# Patient Record
Sex: Female | Born: 1970 | Race: White | Hispanic: No | Marital: Married | Smoking: Never smoker
Health system: Southern US, Community
[De-identification: ages and names within clinical notes are randomized; demographics above are authoritative.]

## PROBLEM LIST (undated history)

## (undated) DIAGNOSIS — J45909 Unspecified asthma, uncomplicated: Secondary | ICD-10-CM

## (undated) DIAGNOSIS — Z87440 Personal history of urinary (tract) infections: Secondary | ICD-10-CM

## (undated) DIAGNOSIS — T7840XA Allergy, unspecified, initial encounter: Secondary | ICD-10-CM

## (undated) HISTORY — DX: Unspecified asthma, uncomplicated: J45.909

## (undated) HISTORY — DX: Personal history of urinary (tract) infections: Z87.440

## (undated) HISTORY — PX: OTHER SURGICAL HISTORY: SHX169

## (undated) HISTORY — DX: Allergy, unspecified, initial encounter: T78.40XA

---

## 1999-03-14 ENCOUNTER — Other Ambulatory Visit: Admission: RE | Admit: 1999-03-14 | Discharge: 1999-03-14 | Payer: Self-pay | Admitting: Obstetrics and Gynecology

## 2000-05-27 ENCOUNTER — Other Ambulatory Visit: Admission: RE | Admit: 2000-05-27 | Discharge: 2000-05-27 | Payer: Self-pay | Admitting: Obstetrics and Gynecology

## 2001-08-16 ENCOUNTER — Other Ambulatory Visit: Admission: RE | Admit: 2001-08-16 | Discharge: 2001-08-16 | Payer: Self-pay | Admitting: Obstetrics and Gynecology

## 2002-04-04 ENCOUNTER — Other Ambulatory Visit: Admission: RE | Admit: 2002-04-04 | Discharge: 2002-04-04 | Payer: Self-pay | Admitting: Obstetrics and Gynecology

## 2002-11-13 ENCOUNTER — Other Ambulatory Visit: Admission: RE | Admit: 2002-11-13 | Discharge: 2002-11-13 | Payer: Self-pay | Admitting: Obstetrics and Gynecology

## 2004-02-15 ENCOUNTER — Other Ambulatory Visit: Admission: RE | Admit: 2004-02-15 | Discharge: 2004-02-15 | Payer: Self-pay | Admitting: Obstetrics and Gynecology

## 2004-05-09 ENCOUNTER — Emergency Department (HOSPITAL_COMMUNITY): Admission: EM | Admit: 2004-05-09 | Discharge: 2004-05-10 | Payer: Self-pay | Admitting: Emergency Medicine

## 2011-12-11 ENCOUNTER — Ambulatory Visit (INDEPENDENT_AMBULATORY_CARE_PROVIDER_SITE_OTHER): Payer: BC Managed Care – PPO

## 2011-12-11 DIAGNOSIS — Z23 Encounter for immunization: Secondary | ICD-10-CM

## 2012-09-05 ENCOUNTER — Ambulatory Visit (INDEPENDENT_AMBULATORY_CARE_PROVIDER_SITE_OTHER): Payer: BC Managed Care – PPO | Admitting: Family Medicine

## 2012-09-05 DIAGNOSIS — Z23 Encounter for immunization: Secondary | ICD-10-CM

## 2015-03-26 ENCOUNTER — Encounter: Payer: Self-pay | Admitting: Physician Assistant

## 2015-03-26 ENCOUNTER — Ambulatory Visit (INDEPENDENT_AMBULATORY_CARE_PROVIDER_SITE_OTHER): Payer: BLUE CROSS/BLUE SHIELD | Admitting: Physician Assistant

## 2015-03-26 VITALS — BP 116/76 | HR 71 | Temp 98.0°F | Resp 16 | Ht 67.0 in | Wt 216.0 lb

## 2015-03-26 DIAGNOSIS — Z8742 Personal history of other diseases of the female genital tract: Secondary | ICD-10-CM

## 2015-03-26 DIAGNOSIS — Z7689 Persons encountering health services in other specified circumstances: Secondary | ICD-10-CM

## 2015-03-26 DIAGNOSIS — Z6833 Body mass index (BMI) 33.0-33.9, adult: Secondary | ICD-10-CM | POA: Diagnosis not present

## 2015-03-26 DIAGNOSIS — Z7189 Other specified counseling: Secondary | ICD-10-CM | POA: Diagnosis not present

## 2015-03-26 DIAGNOSIS — Z23 Encounter for immunization: Secondary | ICD-10-CM | POA: Diagnosis not present

## 2015-03-26 DIAGNOSIS — Z6830 Body mass index (BMI) 30.0-30.9, adult: Secondary | ICD-10-CM | POA: Insufficient documentation

## 2015-03-26 NOTE — Progress Notes (Signed)
Subjective:   Patient ID: Kelli Potter, female     DOB: July 05, 1971, 44 y.o.    MRN: [id number]  PCP: No primary care provider on file.  Chief Complaint  Patient presents with  . Establish Care     Prior to Admission medications   Medication Sig Start Date End Date Taking? Authorizing Provider  ibuprofen (ADVIL,MOTRIN) 100 MG chewable tablet Chew by mouth every 8 (eight) hours as needed.   Yes Historical Provider, MD     Allergies  Allergen Reactions  . Latex      Patient Active Problem List   Diagnosis Date Noted  . BMI 33.0-33.9,adult 03/26/2015     Family History  Problem Relation Age of Onset  . Pulmonary fibrosis Mother   . Hypertension Father   . Eating disorder Sister   . Thyroid disease Brother     Graves Disease     History   Social History  . Marital Status: Married    Spouse Name: Sadie HaberDavid Steeber  . Number of Children: 2  . Years of Education: 15   Occupational History  . 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 Smoking status: Never Smoker   . Smokeless tobacco: Never Used  . Alcohol Use: No  . Drug Use: No  . Sexual Activity:    Partners: Male    Birth Control/ Protection: Other-see comments     Comment: partner (Husband) s/p vasectomy   Other Topics Concern  . Not on file   Social History Narrative   Lives with her husband. 2 sons in college.   She left college after 3 years without a degree.   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     HPI Presents to establish for primary care. As not had a "physical" in "years" though did have an administrative exam before starting nursing school several years ago. Husband is stressed out, taking it out on her. She's avoiding him, and so he thinks that she's "going through the change." He has pushed her to schedule this appointment. Her CPE with pap testing is scheduled for 06/04/2015. Married 23 years. 2 sons.  Review of Systems Review of Systems    Constitutional: Negative.   HENT: Negative.   Eyes: Negative.   Respiratory: Negative.   Cardiovascular: Negative.   Gastrointestinal: Negative.   Genitourinary: Negative.   Musculoskeletal: Negative.   Skin: Negative.   Neurological: Negative.   Psychiatric/Behavioral: Negative.         Objective:  Physical Exam Physical Exam  Constitutional: She is oriented to person, place, and time. She appears well-developed and well-nourished. She is active and cooperative. No distress.  BP 116/76 mmHg  Pulse 71  Temp(Src) 98 F (36.7 C)  Resp 16  Ht 5\' 7"  (1.702 m)  Wt 216 lb (97.977 kg)  BMI 33.82 kg/m2  SpO2 100%  LMP 03/08/2015   Eyes: Conjunctivae are normal.  Neck: Neck supple. No thyromegaly present.  Cardiovascular: Normal rate, regular rhythm, normal heart sounds and intact distal pulses.   Pulmonary/Chest: Effort normal and breath sounds normal.  Lymphadenopathy:    She has no cervical adenopathy.  Neurological: She is alert and oriented to person, place, and time.  Psychiatric: She has a normal mood and affect. Her speech is normal and behavior is normal.       Assessment & Plan:  1. Encounter to establish care  2. BMI 33.0-33.9,adult Healthy eating. Increase cardiovascular exercise.  3. History of abnormal cervical Pap smear Update at CPE 06/04/2015.  4. Need for TD vaccine - Td vaccine greater than or equal to 7yo preservative free IM   Fernande Bras, PA-C Physician Assistant-Certified [facility]

## 2015-05-14 ENCOUNTER — Emergency Department (HOSPITAL_COMMUNITY): Payer: BLUE CROSS/BLUE SHIELD | Admitting: Certified Registered Nurse Anesthetist

## 2015-05-14 ENCOUNTER — Encounter (HOSPITAL_COMMUNITY): Payer: Self-pay

## 2015-05-14 ENCOUNTER — Encounter (HOSPITAL_COMMUNITY)
Admission: EM | Disposition: A | Discharge status: Home / Self Care | Payer: Self-pay | Source: Home / Self Care | Attending: Emergency Medicine

## 2015-05-14 ENCOUNTER — Emergency Department (HOSPITAL_COMMUNITY): Payer: BLUE CROSS/BLUE SHIELD

## 2015-05-14 ENCOUNTER — Observation Stay (HOSPITAL_COMMUNITY)
Admission: EM | Admit: 2015-05-14 | Discharge: 2015-05-15 | Disposition: A | Discharge status: Home / Self Care | Payer: BLUE CROSS/BLUE SHIELD | Attending: General Surgery | Admitting: General Surgery

## 2015-05-14 ENCOUNTER — Ambulatory Visit (INDEPENDENT_AMBULATORY_CARE_PROVIDER_SITE_OTHER): Payer: BLUE CROSS/BLUE SHIELD | Admitting: Emergency Medicine

## 2015-05-14 VITALS — BP 116/80 | HR 85 | Temp 98.0°F | Resp 16 | Ht 67.0 in | Wt 213.8 lb

## 2015-05-14 DIAGNOSIS — Z8249 Family history of ischemic heart disease and other diseases of the circulatory system: Secondary | ICD-10-CM | POA: Diagnosis not present

## 2015-05-14 DIAGNOSIS — K3580 Unspecified acute appendicitis: Principal | ICD-10-CM | POA: Insufficient documentation

## 2015-05-14 DIAGNOSIS — Z9049 Acquired absence of other specified parts of digestive tract: Secondary | ICD-10-CM

## 2015-05-14 DIAGNOSIS — Z91040 Latex allergy status: Secondary | ICD-10-CM | POA: Diagnosis not present

## 2015-05-14 DIAGNOSIS — R1031 Right lower quadrant pain: Secondary | ICD-10-CM

## 2015-05-14 HISTORY — PX: APPENDECTOMY: SHX54

## 2015-05-14 HISTORY — PX: LAPAROSCOPIC APPENDECTOMY: SHX408

## 2015-05-14 LAB — COMPREHENSIVE METABOLIC PANEL
ALT: 16 U/L (ref 14–54)
AST: 24 U/L (ref 15–41)
Albumin: 3.5 g/dL (ref 3.5–5.0)
Alkaline Phosphatase: 68 U/L (ref 38–126)
Anion gap: 11 (ref 5–15)
BILIRUBIN TOTAL: 0.6 mg/dL (ref 0.3–1.2)
BUN: 8 mg/dL (ref 6–20)
CALCIUM: 8.9 mg/dL (ref 8.9–10.3)
CHLORIDE: 105 mmol/L (ref 101–111)
CO2: 23 mmol/L (ref 22–32)
CREATININE: 0.83 mg/dL (ref 0.44–1.00)
GLUCOSE: 99 mg/dL (ref 65–99)
Potassium: 3.9 mmol/L (ref 3.5–5.1)
Sodium: 139 mmol/L (ref 135–145)
Total Protein: 7.2 g/dL (ref 6.5–8.1)

## 2015-05-14 LAB — CBC WITH DIFFERENTIAL/PLATELET
BASOS ABS: 0 10*3/uL (ref 0.0–0.1)
BASOS PCT: 0 % (ref 0–1)
EOS ABS: 0.1 10*3/uL (ref 0.0–0.7)
EOS PCT: 2 % (ref 0–5)
HEMATOCRIT: 37.5 % (ref 36.0–46.0)
HEMOGLOBIN: 12.4 g/dL (ref 12.0–15.0)
Lymphocytes Relative: 21 % (ref 12–46)
Lymphs Abs: 1.9 10*3/uL (ref 0.7–4.0)
MCH: 29.1 pg (ref 26.0–34.0)
MCHC: 33.1 g/dL (ref 30.0–36.0)
MCV: 88 fL (ref 78.0–100.0)
MONO ABS: 0.5 10*3/uL (ref 0.1–1.0)
MONOS PCT: 6 % (ref 3–12)
Neutro Abs: 6.4 10*3/uL (ref 1.7–7.7)
Neutrophils Relative %: 71 % (ref 43–77)
Platelets: 306 10*3/uL (ref 150–400)
RBC: 4.26 MIL/uL (ref 3.87–5.11)
RDW: 13.2 % (ref 11.5–15.5)
WBC: 9 10*3/uL (ref 4.0–10.5)

## 2015-05-14 LAB — POCT CBC
GRANULOCYTE PERCENT: 71.9 % (ref 37–80)
HEMATOCRIT: 37.6 % — AB (ref 37.7–47.9)
Hemoglobin: 12.3 g/dL (ref 12.2–16.2)
Lymph, poc: 2 (ref 0.6–3.4)
MCH, POC: 28.9 pg (ref 27–31.2)
MCHC: 32.7 g/dL (ref 31.8–35.4)
MCV: 88.4 fL (ref 80–97)
MID (CBC): 0.6 (ref 0–0.9)
MPV: 8.3 fL (ref 0–99.8)
PLATELET COUNT, POC: 321 10*3/uL (ref 142–424)
POC GRANULOCYTE: 6.6 (ref 2–6.9)
POC LYMPH %: 22.1 % (ref 10–50)
POC MID %: 6 % (ref 0–12)
RBC: 4.25 M/uL (ref 4.04–5.48)
RDW, POC: 13.4 %
WBC: 9.2 10*3/uL (ref 4.6–10.2)

## 2015-05-14 LAB — URINALYSIS, ROUTINE W REFLEX MICROSCOPIC
Bilirubin Urine: NEGATIVE
Glucose, UA: NEGATIVE mg/dL
HGB URINE DIPSTICK: NEGATIVE
KETONES UR: NEGATIVE mg/dL
Leukocytes, UA: NEGATIVE
Nitrite: NEGATIVE
PROTEIN: NEGATIVE mg/dL
Specific Gravity, Urine: 1.008 (ref 1.005–1.030)
UROBILINOGEN UA: 0.2 mg/dL (ref 0.0–1.0)
pH: 6 (ref 5.0–8.0)

## 2015-05-14 LAB — WET PREP, GENITAL: Trich, Wet Prep: NONE SEEN

## 2015-05-14 LAB — POC URINE PREG, ED: PREG TEST UR: NEGATIVE

## 2015-05-14 LAB — LIPASE, BLOOD: LIPASE: 22 U/L (ref 22–51)

## 2015-05-14 SURGERY — APPENDECTOMY, LAPAROSCOPIC
Anesthesia: General | Site: Abdomen

## 2015-05-14 MED ORDER — FENTANYL CITRATE (PF) 250 MCG/5ML IJ SOLN
INTRAMUSCULAR | Status: AC
  Filled 2015-05-14: qty 5

## 2015-05-14 MED ORDER — SUGAMMADEX SODIUM 200 MG/2ML IV SOLN
INTRAVENOUS | Status: DC | PRN
  Administered 2015-05-14: 200 mg via INTRAVENOUS

## 2015-05-14 MED ORDER — HYDROMORPHONE HCL 1 MG/ML IJ SOLN
0.2500 mg | INTRAMUSCULAR | Status: DC | PRN
  Administered 2015-05-14 (×4): 0.5 mg via INTRAVENOUS

## 2015-05-14 MED ORDER — SUGAMMADEX SODIUM 200 MG/2ML IV SOLN
INTRAVENOUS | Status: AC
  Filled 2015-05-14: qty 2

## 2015-05-14 MED ORDER — HYDROMORPHONE HCL 1 MG/ML IJ SOLN
INTRAMUSCULAR | Status: AC
  Filled 2015-05-14: qty 1

## 2015-05-14 MED ORDER — ROCURONIUM BROMIDE 100 MG/10ML IV SOLN
INTRAVENOUS | Status: DC | PRN
  Administered 2015-05-14: 40 mg via INTRAVENOUS

## 2015-05-14 MED ORDER — SODIUM CHLORIDE 0.9 % IV SOLN
INTRAVENOUS | Status: DC
  Administered 2015-05-15: 02:00:00 via INTRAVENOUS

## 2015-05-14 MED ORDER — PROPOFOL 10 MG/ML IV BOLUS
INTRAVENOUS | Status: DC | PRN
  Administered 2015-05-14: 150 mg via INTRAVENOUS

## 2015-05-14 MED ORDER — ACETAMINOPHEN 650 MG RE SUPP: 650.0000 mg | Freq: Four times a day (QID) | RECTAL | Status: DC | PRN

## 2015-05-14 MED ORDER — DEXTROSE 5 % IV SOLN
1.0000 g | Freq: Four times a day (QID) | INTRAVENOUS | Status: DC
  Administered 2015-05-15 (×2): 1 g via INTRAVENOUS
  Filled 2015-05-14 (×3): qty 1

## 2015-05-14 MED ORDER — IOHEXOL 300 MG/ML  SOLN
25.0000 mL | Freq: Once | INTRAMUSCULAR | Status: AC | PRN
  Administered 2015-05-14: 25 mL via ORAL

## 2015-05-14 MED ORDER — ONDANSETRON HCL 4 MG/2ML IJ SOLN: 4.0000 mg | Freq: Once | INTRAMUSCULAR | Status: DC | PRN

## 2015-05-14 MED ORDER — MIDAZOLAM HCL 2 MG/2ML IJ SOLN
INTRAMUSCULAR | Status: AC
  Filled 2015-05-14: qty 2

## 2015-05-14 MED ORDER — DEXTROSE 5 % IV SOLN
2.0000 g | Freq: Once | INTRAVENOUS | Status: AC
  Administered 2015-05-14: 2 g via INTRAVENOUS
  Filled 2015-05-14: qty 2

## 2015-05-14 MED ORDER — MORPHINE SULFATE 4 MG/ML IJ SOLN
4.0000 mg | Freq: Once | INTRAMUSCULAR | Status: AC
  Administered 2015-05-14: 4 mg via INTRAVENOUS
  Filled 2015-05-14: qty 1

## 2015-05-14 MED ORDER — PROPOFOL 10 MG/ML IV BOLUS
INTRAVENOUS | Status: AC
  Filled 2015-05-14: qty 20

## 2015-05-14 MED ORDER — FENTANYL CITRATE (PF) 100 MCG/2ML IJ SOLN
INTRAMUSCULAR | Status: DC | PRN
  Administered 2015-05-14: 100 ug via INTRAVENOUS
  Administered 2015-05-14: 50 ug via INTRAVENOUS

## 2015-05-14 MED ORDER — ONDANSETRON HCL 4 MG/2ML IJ SOLN
INTRAMUSCULAR | Status: DC | PRN
  Administered 2015-05-14: 4 mg via INTRAVENOUS

## 2015-05-14 MED ORDER — ONDANSETRON HCL 4 MG/2ML IJ SOLN
4.0000 mg | Freq: Once | INTRAMUSCULAR | Status: AC
  Administered 2015-05-14: 4 mg via INTRAVENOUS
  Filled 2015-05-14: qty 2

## 2015-05-14 MED ORDER — LACTATED RINGERS IV SOLN
INTRAVENOUS | Status: DC | PRN
  Administered 2015-05-14 (×2): via INTRAVENOUS

## 2015-05-14 MED ORDER — HYDROMORPHONE HCL 1 MG/ML IJ SOLN
0.2500 mg | INTRAMUSCULAR | Status: DC | PRN
  Administered 2015-05-14 (×2): 0.5 mg via INTRAVENOUS

## 2015-05-14 MED ORDER — GLYCOPYRROLATE 0.2 MG/ML IJ SOLN
INTRAMUSCULAR | Status: DC | PRN
Start: 2015-05-14 — End: 2015-05-14
  Administered 2015-05-14: 0.4 mg via INTRAVENOUS

## 2015-05-14 MED ORDER — MEPERIDINE HCL 25 MG/ML IJ SOLN: 6.2500 mg | INTRAMUSCULAR | Status: DC | PRN

## 2015-05-14 MED ORDER — IOHEXOL 300 MG/ML  SOLN
100.0000 mL | Freq: Once | INTRAMUSCULAR | Status: AC | PRN
  Administered 2015-05-14: 100 mL via INTRAVENOUS

## 2015-05-14 MED ORDER — ONDANSETRON HCL 4 MG/2ML IJ SOLN: 4.0000 mg | Freq: Four times a day (QID) | INTRAMUSCULAR | Status: DC | PRN

## 2015-05-14 MED ORDER — SODIUM CHLORIDE 0.9 % IR SOLN
Status: DC | PRN
  Administered 2015-05-14: 1000 mL

## 2015-05-14 MED ORDER — OXYCODONE HCL 5 MG PO TABS: 5.0000 mg | ORAL_TABLET | ORAL | Status: DC | PRN

## 2015-05-14 MED ORDER — MIDAZOLAM HCL 5 MG/5ML IJ SOLN
INTRAMUSCULAR | Status: DC | PRN
  Administered 2015-05-14: 2 mg via INTRAVENOUS

## 2015-05-14 MED ORDER — BUPIVACAINE-EPINEPHRINE 0.25% -1:200000 IJ SOLN
INTRAMUSCULAR | Status: DC | PRN
  Administered 2015-05-14: 10 mL

## 2015-05-14 MED ORDER — HEPARIN SODIUM (PORCINE) 5000 UNIT/ML IJ SOLN: 5000.0000 [IU] | Freq: Three times a day (TID) | INTRAMUSCULAR | Status: DC

## 2015-05-14 MED ORDER — ACETAMINOPHEN 325 MG PO TABS: 650.0000 mg | ORAL_TABLET | Freq: Four times a day (QID) | ORAL | Status: DC | PRN

## 2015-05-14 MED ORDER — PHENYLEPHRINE HCL 10 MG/ML IJ SOLN
INTRAMUSCULAR | Status: DC | PRN
  Administered 2015-05-14: 40 ug via INTRAVENOUS

## 2015-05-14 MED ORDER — LIDOCAINE HCL (CARDIAC) 20 MG/ML IV SOLN
INTRAVENOUS | Status: DC | PRN
  Administered 2015-05-14: 100 mg via INTRAVENOUS

## 2015-05-14 MED ORDER — SUCCINYLCHOLINE CHLORIDE 20 MG/ML IJ SOLN
INTRAMUSCULAR | Status: DC | PRN
  Administered 2015-05-14: 160 mg via INTRAVENOUS

## 2015-05-14 MED ORDER — BUPIVACAINE-EPINEPHRINE (PF) 0.25% -1:200000 IJ SOLN
INTRAMUSCULAR | Status: AC
  Filled 2015-05-14: qty 30

## 2015-05-14 MED ORDER — MORPHINE SULFATE 2 MG/ML IJ SOLN
2.0000 mg | INTRAMUSCULAR | Status: DC | PRN
  Administered 2015-05-15 (×2): 2 mg via INTRAVENOUS
  Filled 2015-05-14 (×2): qty 1

## 2015-05-14 MED ORDER — 0.9 % SODIUM CHLORIDE (POUR BTL) OPTIME
TOPICAL | Status: DC | PRN
  Administered 2015-05-14: 1000 mL

## 2015-05-14 MED ORDER — KETOROLAC TROMETHAMINE 15 MG/ML IJ SOLN
15.0000 mg | Freq: Four times a day (QID) | INTRAMUSCULAR | Status: DC | PRN
  Administered 2015-05-15 (×2): 15 mg via INTRAVENOUS
  Filled 2015-05-14 (×2): qty 1

## 2015-05-14 SURGICAL SUPPLY — 39 items
APPLIER CLIP ROT 10 11.4 M/L (STAPLE)
APR CLP MED LRG 11.4X10 (STAPLE)
CANISTER SUCTION 2500CC (MISCELLANEOUS) ×2 IMPLANT
CHLORAPREP W/TINT 26ML (MISCELLANEOUS) ×2 IMPLANT
CLIP APPLIE ROT 10 11.4 M/L (STAPLE) IMPLANT
COVER SURGICAL LIGHT HANDLE (MISCELLANEOUS) ×2 IMPLANT
CUTTER FLEX LINEAR 45M (STAPLE) ×2 IMPLANT
DEVICE TROCAR PUNCTURE CLOSURE (ENDOMECHANICALS) ×2 IMPLANT
DRAPE LAPAROSCOPIC ABDOMINAL (DRAPES) ×2 IMPLANT
ELECT REM PT RETURN 9FT ADLT (ELECTROSURGICAL) ×2
ELECTRODE REM PT RTRN 9FT ADLT (ELECTROSURGICAL) ×1 IMPLANT
GLOVE BIOGEL PI IND STRL 7.5 (GLOVE) ×1 IMPLANT
GLOVE BIOGEL PI INDICATOR 7.5 (GLOVE) ×1
GOWN STRL REUS W/ TWL LRG LVL3 (GOWN DISPOSABLE) ×3 IMPLANT
GOWN STRL REUS W/TWL LRG LVL3 (GOWN DISPOSABLE) ×3
KIT BASIN OR (CUSTOM PROCEDURE TRAY) ×2 IMPLANT
KIT ROOM TURNOVER OR (KITS) ×2 IMPLANT
LIQUID BAND (GAUZE/BANDAGES/DRESSINGS) ×2 IMPLANT
NS IRRIG 1000ML POUR BTL (IV SOLUTION) ×2 IMPLANT
PAD ARMBOARD 7.5X6 YLW CONV (MISCELLANEOUS) ×4 IMPLANT
POUCH RETRIEVAL ECOSAC 10 (ENDOMECHANICALS) ×1 IMPLANT
POUCH RETRIEVAL ECOSAC 10MM (ENDOMECHANICALS) ×1
RELOAD 45 VASCULAR/THIN (ENDOMECHANICALS) IMPLANT
RELOAD STAPLE TA45 3.5 REG BLU (ENDOMECHANICALS) ×2 IMPLANT
SCALPEL HARMONIC ACE (MISCELLANEOUS) ×2 IMPLANT
SCISSORS LAP 5X35 DISP (ENDOMECHANICALS) IMPLANT
SET IRRIG TUBING LAPAROSCOPIC (IRRIGATION / IRRIGATOR) ×2 IMPLANT
SLEEVE ENDOPATH XCEL 5M (ENDOMECHANICALS) ×2 IMPLANT
SPECIMEN JAR SMALL (MISCELLANEOUS) ×2 IMPLANT
STRIP CLOSURE SKIN 1/2X4 (GAUZE/BANDAGES/DRESSINGS) ×2 IMPLANT
SUT MNCRL AB 4-0 PS2 18 (SUTURE) ×2 IMPLANT
SUT VIC AB 0 CT1 27 (SUTURE) ×2
SUT VIC AB 0 CT1 27XBRD ANBCTR (SUTURE) ×1 IMPLANT
TOWEL OR 17X24 6PK STRL BLUE (TOWEL DISPOSABLE) ×2 IMPLANT
TOWEL OR 17X26 10 PK STRL BLUE (TOWEL DISPOSABLE) ×2 IMPLANT
TRAY LAPAROSCOPIC (CUSTOM PROCEDURE TRAY) ×2 IMPLANT
TROCAR XCEL BLUNT TIP 100MML (ENDOMECHANICALS) ×2 IMPLANT
TROCAR XCEL NON-BLD 5MMX100MML (ENDOMECHANICALS) ×2 IMPLANT
TUBING INSUFFLATION (TUBING) ×2 IMPLANT

## 2015-05-14 NOTE — ED Provider Notes (Signed)
I saw and evaluated the patient, reviewed the resident's note and I agree with the findings and plan.  Pt was seen at an UC.  Sent for concerns about acute appendicitis.  On exam with TTP RLQ. Guarding mid right abdomen and lower abdomen.    Plan on labs, pelvic and CT scan.  Linwood DibblesJon Kelseigh Diver, MD 05/14/15 [phone]

## 2015-05-14 NOTE — Anesthesia Procedure Notes (Signed)
Procedure Name: Intubation Date/Time: 05/14/2015 8:53 PM Performed by: Adonis HousekeeperNGELL, Lexani Corona M Pre-anesthesia Checklist: Patient identified, Emergency Drugs available, Suction available and Patient being monitored Patient Re-evaluated:Patient Re-evaluated prior to inductionOxygen Delivery Method: Circle system utilized Preoxygenation: Pre-oxygenation with 100% oxygen Intubation Type: IV induction, Rapid sequence and Cricoid Pressure applied Laryngoscope Size: Miller and 2 Grade View: Grade I Tube type: Oral Tube size: 7.0 mm Number of attempts: 1 Airway Equipment and Method: Stylet Placement Confirmation: ETT inserted through vocal cords under direct vision,  positive ETCO2 and breath sounds checked- equal and bilateral Secured at: 22 cm Tube secured with: Tape Dental Injury: Teeth and Oropharynx as per pre-operative assessment

## 2015-05-14 NOTE — Patient Instructions (Signed)
AppendicitisAbdominal Pain Many things can cause abdominal pain. Usually, abdominal pain is not caused by a disease and will improve without treatment. It can often be observed and treated at home. Your health care provider will do a physical exam and possibly order blood tests and X-rays to help determine the seriousness of your pain. However, in many cases, more time must pass before a clear cause of the pain can be found. Before that point, your health care provider may not know if you need more testing or further treatment. HOME CARE INSTRUCTIONS  Monitor your abdominal pain for any changes. The following actions may help to alleviate any discomfort you are experiencing:  Only take over-the-counter or prescription medicines as directed by your health care provider.  Do not take laxatives unless directed to do so by your health care provider.  Try a clear liquid diet (broth, tea, or water) as directed by your health care provider. Slowly move to a bland diet as tolerated. SEEK MEDICAL CARE IF:  You have unexplained abdominal pain.  You have abdominal pain associated with nausea or diarrhea.  You have pain when you urinate or have a bowel movement.  You experience abdominal pain that wakes you in the night.  You have abdominal pain that is worsened or improved by eating food.  You have abdominal pain that is worsened with eating fatty foods.  You have a fever. SEEK IMMEDIATE MEDICAL CARE IF:   Your pain does not go away within 2 hours.  You keep throwing up (vomiting).  Your pain is felt only in portions of the abdomen, such as the right side or the left lower portion of the abdomen.  You pass bloody or black tarry stools. MAKE SURE YOU:  Understand these instructions.   Will watch your condition.   Will get help right away if you are not doing well or get worse.  Document Released: 09/23/2005 Document Revised: 12/19/2013 Document Reviewed: 08/23/2013 [facility] Patient  Information 2015 [place], [place]. This information is not intended to replace advice given to you by your health care provider. Make sure you discuss any questions you have with your health care provider.

## 2015-05-14 NOTE — Anesthesia Preprocedure Evaluation (Addendum)
Anesthesia Evaluation  Patient identified by MRN, date of birth, ID band Patient awake    Reviewed: Allergy & Precautions, NPO status , Patient's Chart, lab work & pertinent test results  Airway Mallampati: I  TM Distance: >3 FB Neck ROM: Full    Dental   Pulmonary neg pulmonary ROS,    Pulmonary exam normal       Cardiovascular negative cardio ROS Normal cardiovascular exam    Neuro/Psych negative neurological ROS  negative psych ROS   GI/Hepatic negative GI ROS, Neg liver ROS,   Endo/Other  negative endocrine ROS  Renal/GU Kidney issues as a child     Musculoskeletal   Abdominal   Peds  Hematology   Anesthesia Other Findings   Reproductive/Obstetrics                            Anesthesia Physical Anesthesia Plan  ASA: II and emergent  Anesthesia Plan: General   Post-op Pain Management:    Induction: Intravenous, Rapid sequence and Cricoid pressure planned  Airway Management Planned: Oral ETT  Additional Equipment:   Intra-op Plan:   Post-operative Plan: Extubation in OR  Informed Consent: I have reviewed the patients History and Physical, chart, labs and discussed the procedure including the risks, benefits and alternatives for the proposed anesthesia with the patient or authorized representative who has indicated his/her understanding and acceptance.     Plan Discussed with: CRNA and Surgeon  Anesthesia Plan Comments:         Anesthesia Quick Evaluation

## 2015-05-14 NOTE — ED Provider Notes (Signed)
4:12 PM Patient care transfer from Dr. Artis FlockWolfe. 44 year old female with abdominal pain concerning for possible appendicitis. Labs reassuring. Hemodynamically stable. Awaiting CT scan.  7:02 PM CT shows appendicitis. Patient is hemodynamically stable. Nothing by mouth since yesterday. Discussed with surgery who will admit patient.  Bridgett Larssonhris Davonte Siebenaler, MD 05/14/15 [phone]

## 2015-05-14 NOTE — ED Provider Notes (Signed)
CSN: [id number]     Arrival date & time 05/14/15  1406 History   First MD Initiated Contact with Patient 05/14/15 1420     Chief Complaint  Patient presents with  . Abdominal Pain     (Consider location/radiation/quality/duration/timing/severity/associated sxs/prior Treatment) Patient is a 44 y.o. female presenting with abdominal pain. The history is provided by the patient.  Abdominal Pain Pain location:  RLQ Pain quality: sharp   Pain radiates to:  Does not radiate Pain severity:  Moderate Onset quality:  Gradual Duration:  3 days Timing:  Constant Progression:  Worsening Chronicity:  New Context: not previous surgeries, not recent illness, not suspicious food intake and not trauma   Relieved by:  Nothing Worsened by:  Movement and eating Ineffective treatments:  None tried Associated symptoms: anorexia and nausea   Associated symptoms: no chest pain, no chills, no constipation, no cough, no diarrhea, no dysuria, no fatigue, no fever, no hematemesis, no hematochezia, no hematuria, no melena, no shortness of breath, no vaginal bleeding, no vaginal discharge and no vomiting     Past Medical History  Diagnosis Date  . History of kidney infection    Past Surgical History  Procedure Laterality Date  . Kidney evaluation under anesthesia     Family History  Problem Relation Age of Onset  . Pulmonary fibrosis Mother   . Hypertension Father   . Eating disorder Sister   . Thyroid disease Brother     Graves Disease   History  Substance Use Topics  . Smoking status: Never Smoker   . Smokeless tobacco: Never Used  . Alcohol Use: No   OB History    No data available     Review of Systems  Constitutional: Positive for appetite change. Negative for fever, chills, diaphoresis and fatigue.  Respiratory: Negative for cough, chest tightness and shortness of breath.   Cardiovascular: Negative for chest pain, palpitations and leg swelling.  Gastrointestinal: Positive for  nausea, abdominal pain and anorexia. Negative for vomiting, diarrhea, constipation, blood in stool, melena, hematochezia and hematemesis.  Genitourinary: Negative for dysuria, urgency, frequency, hematuria, flank pain, decreased urine volume, vaginal bleeding, vaginal discharge, difficulty urinating, vaginal pain, menstrual problem and pelvic pain.  Musculoskeletal: Negative for back pain, neck pain and neck stiffness.  Skin: Negative for color change, pallor and rash.  Neurological: Negative for dizziness, syncope, weakness, light-headedness, numbness and headaches.  All other systems reviewed and are negative.     Allergies  Latex  Home Medications   Prior to Admission medications   Medication Sig Start Date End Date Taking? Authorizing Provider  ibuprofen (ADVIL,MOTRIN) 100 MG chewable tablet Chew by mouth every 8 (eight) hours as needed.   Yes Historical Provider, MD  Multiple Vitamins-Minerals (MULTIVITAMIN WITH MINERALS) tablet Take 1 tablet by mouth daily.   Yes Historical Provider, MD  naproxen sodium (ANAPROX) 220 MG tablet Take 220 mg by mouth 2 (two) times daily with a meal.   Yes Historical Provider, MD   BP 116/62 mmHg  Pulse 60  Temp(Src) 98.1 F (36.7 C) (Oral)  Resp 18  Ht 5\' 7"  (1.702 m)  Wt 214 lb 3.2 oz (97.16 kg)  BMI 33.54 kg/m2  SpO2 97%  LMP 05/05/2015 Physical Exam  Constitutional: She is oriented to person, place, and time. She appears well-developed and well-nourished. No distress.  HENT:  Head: Normocephalic and atraumatic.  Mouth/Throat: Oropharynx is clear and moist.  Eyes: Conjunctivae and EOM are normal. Pupils are equal, round, and reactive to light.  Neck: Normal range of motion. Neck supple.  Cardiovascular: Normal rate, regular rhythm, normal heart sounds and intact distal pulses.  Exam reveals no gallop and no friction rub.   No murmur heard. Pulmonary/Chest: Effort normal and breath sounds normal. No respiratory distress. She has no  wheezes. She has no rales.  Abdominal: Soft. Normal appearance. She exhibits no distension. There is tenderness in the right lower quadrant. There is guarding and tenderness at McBurney's point. There is no rigidity, no rebound, no CVA tenderness and negative Murphy's sign.  Genitourinary: Uterus normal. There is no rash, tenderness or lesion on the right labia. There is no rash, tenderness or lesion on the left labia. Uterus is not tender. Cervix exhibits no motion tenderness, no discharge and no friability. Right adnexum displays no mass, no tenderness and no fullness. Left adnexum displays no mass, no tenderness and no fullness. No tenderness or bleeding in the vagina. No signs of injury around the vagina. No vaginal discharge found.  Musculoskeletal: Normal range of motion. She exhibits no edema or tenderness.  Neurological: She is oriented to person, place, and time. GCS eye subscore is 4. GCS verbal subscore is 5. GCS motor subscore is 6.  Skin: Skin is warm and dry. No rash noted. She is not diaphoretic. No erythema. No pallor.  Nursing note and vitals reviewed.   ED Course  Procedures (including critical care time) Labs Review Labs Reviewed  WET PREP, GENITAL - Abnormal; Notable for the following:    Yeast Wet Prep HPF POC FEW (*)    Clue Cells Wet Prep HPF POC FEW (*)    WBC, Wet Prep HPF POC MODERATE (*)    All other components within normal limits  CBC WITH DIFFERENTIAL/PLATELET  COMPREHENSIVE METABOLIC PANEL  URINALYSIS, ROUTINE W REFLEX MICROSCOPIC  LIPASE, BLOOD  HIV ANTIBODY (ROUTINE TESTING)  POC URINE PREG, ED  GC/CHLAMYDIA PROBE AMP ([place])    Imaging Review Ct Abdomen Pelvis W Contrast  05/14/2015   CLINICAL DATA:  RLQ PAIN X 3 DAYS, NEG FEVER, NAUSEA,  EXAM: CT ABDOMEN AND PELVIS WITH CONTRAST  TECHNIQUE: Multidetector CT imaging of the abdomen and pelvis was performed using the standard protocol following bolus administration of intravenous contrast.  CONTRAST:   100mL OMNIPAQUE IOHEXOL 300 MG/ML  SOLN  COMPARISON:  05/09/2004  FINDINGS: Minimal dependent atelectasis posteriorly in the visualized lung bases. Unremarkable liver, nondilated gallbladder, spleen, adrenal glands, pancreas, kidneys. Aorta and portal vein unremarkable. Stomach, small bowel, and colon are nondilated. The appendix is thick walled, dilated to a diameter of 11 mm, with some adjacent inflammatory/edematous change. No extraluminal gas or fluid collection is identified. Urinary bladder physiologically distended. Uterus and adnexal regions unremarkable. Small amount of fluid in pelvic cul-de-sac. No free air. No hydronephrosis. Minimal degenerative changes in the lumbar spine.  IMPRESSION: 1. Acute appendicitis without evidence of perforation or abscess. Critical Value/emergent results were called by telephone at the time of interpretation on 05/14/2015 at 6:46 pm to Dr. Arlie SolomonsPost, who verbally acknowledged these results.   Electronically Signed   By: Corlis Leak  Hassell M.D.   On: 05/14/2015 18:47     EKG Interpretation None      MDM   Final diagnoses:  RLQ abdominal pain    44 yo F with no significant PMH presenting from urgent care with abdominal pain, concern for appendicitis.  Pt reports generalized lower abdominal pain onset 2 days ago.  Gradual worsening and localization to RLQ, with severe pain last night.  Pain  worse with movement, laying flat.  +nausea, anorexia; no fever, vomiting, diarrhea, dysuria, vaginal bleeding or discharge. LMP 2 weeks ago.    On presentation, pt AFVSS, in NAD.  CV and lung exam WNL.  Abdomen soft although exquisite TTP in RLQ with guarding;  No rebound, negative Rovsing's sign.  No CVAT.  Pelvic exam with no CMT, vaginal bleeding or discharge, no adnexal fullness or TTP, doubt ovarian torsion, TOA.  UPT negative no ectopic.  History and exam most concerning for appendicitis. Differential also includes cholecystitis, kidney stone, pyelo.  Plan for labs, U/A, CT  A/P.  Lab results reassuring.  Care of pt to Dr. Arlie Solomons at 16:00, awaiting CT scan.  Stable at time of transfer.  Discussed with attending Dr. Lynelle Doctor.  Jodean Lima, MD 05/14/15 [phone]

## 2015-05-14 NOTE — Op Note (Signed)
Preoperative diagnosis: Acute appendicitis Postoperative diagnosis: Same as above Procedure: Laparoscopic appendectomy Surgeon: Dr. Harden MoMatt Evertt Chouinard Anesthesia: Gen. Estimated blood loss: Minimal Complications: None Drains: None Specimens: Appendix to pathology Sponge and needle count correct at completion Disposition to recovery stable  Indications: This is a 44 year old female who presents with signs and symptoms consistent with acute appendicitis. She has a CT scan that confirms that. I discussed proceeding to the operating room for laparoscopic appendectomy.  Procedure: After informed consent was obtained the patient was taken to the operating room. She was placed under anesthesia. She had been given antibiotics in the emergency room. Sequential compression devices were on her legs. A Foley catheter was placed. She was then prepped and draped in the standard sterile surgical fashion. A surgical timeout was then performed.  I infiltrated Marcaine below the umbilicus. I made a vertical incision. I grasped the fascia and incised it sharply. I entered into the peritoneum bluntly. I then placed a 0 Vicryl pursestring suture through the fascia. A Hassan trocar was introduced and the abdomen was then insufflated to 15 mmHg pressure. I then inserted 2 5 mm trocars in the suprapubic region and left lower quadrant under direct vision without complication. I then noted the appendix in the anterior position. She had acute appendicitis. This was not perforated. I then was able to release some of the white line to release it from the sidewall.I then dissected the appendiceal base at the cecum. I divided the appendiceal mesentery with the Harmonic scalpel. This was hemostatic. Once I had done this I used a GIA stapler to divide the appendix at the cecum. I placed this in a bag and removed it. I irrigated. This was clear. Hemostasis was again observed. I then removed my Hassan trocar and tied my pursestring down.  I did use the Endo Close device and place an additional 0 Vicryl suture to close the umbilical defect. I then removed my remaining trocars and desufflated the abdomen. I closed these with 4-0 Monocryl and glue. She tolerated this well was extubated and transferred to the recovery room in stable condition.

## 2015-05-14 NOTE — Progress Notes (Signed)
Subjective:  Patient ID: Kelli Potter, female    DOB: [birth year]  Age: 44 y.o. MRN: [id number]  CC: Abdominal Pain   HPI Kelli ShoutsKristin J Potter presents for evaluation of abdominal pain. She has been ill since Sunday. She has anorexia and nausea she has not vomited. She has no fever or chills. She said that the pain began in her lower abdomen and is now migrated to her right lower quadrant. She states that her pain is increasing since Sunday and she is quite uncomfortable and unable to sleep last night She said that the pain is increased by walking upstairs standing up lying down and hitting a railroad tracks or potholes in the car. She has no stool change no dysuria urgency or frequency she has no fever chills She has a negative surgical history with all her parts remaining  No improvement with over the counter medications or other home remedies.  Denies other complaint or health concern today.   Outpatient Prescriptions Prior to Visit  Medication Sig Dispense Refill  . ibuprofen (ADVIL,MOTRIN) 100 MG chewable tablet Chew by mouth every 8 (eight) hours as needed.     No facility-administered medications prior to visit.    ROS Review of Systems   Constitutional: Negative for fever, chills, diaphoresis, activity change, , fatigue and unexpected weight change.  HENT: Negative for hearing loss, congestion, sore throat, rhinorrhea, sneezing, trouble swallowing, neck pain, dental problem, voice change, postnasal drip and tinnitus.  Eyes: Negative for photophobia, pain, discharge, redness and visual disturbance.  Respiratory: Negative for cough, chest tightness, shortness of breath and wheezing.  Cardiovascular: Negative for chest pain, palpitations and leg swelling.  Gastrointestinal: Negative for diarrhea, constipation and blood in stool.  Endocrine: Negative for cold intolerance, heat intolerance and polyuria.  Genitourinary: Negative for dysuria, urgency, frequency, hematuria, flank  pain, vaginal bleeding, vaginal discharge, difficulty urinating, genital sores, menstrual problem and pelvic pain.  Musculoskeletal: Negative for back pain and gait problem.  Neurological: Negative for dizziness, weakness and headaches.  Hematological: Negative for adenopathy. Does not bruise/bleed easily.  Psychiatric/Behavioral: Negative for sleep disturbance, self-injury and dysphoric mood. The patient is not nervous/anxious.  currently on menses which seem normal/    Objective:  BP 116/80 mmHg  Pulse 85  Temp(Src) 98 F (36.7 C) (Oral)  Resp 16  Ht 5\' 7"  (1.702 m)  Wt 213 lb 12.8 oz (96.979 kg)  BMI 33.48 kg/m2  SpO2 98%  LMP 05/05/2015  BP Readings from Last 3 Encounters:  05/14/15 116/80  03/26/15 116/76    Wt Readings from Last 3 Encounters:  05/14/15 213 lb 12.8 oz (96.979 kg)  03/26/15 216 lb (97.977 kg)    Physical Exam  GEN: WDWN, moderate distress, Non-toxic, A & O x 3 HEENT: Atraumatic, Normocephalic. Neck supple. No masses, No LAD. Ears and Nose: No external deformity. CV: RRR, No M/G/R. No JVD. No thrill. No extra heart sounds. PULM: CTA B, no wheezes, crackles, rhonchi. No retractions. No resp. distress. No accessory muscle use. ABD: S, marked right lower quadrant tenderness, ND, +BS. Direct RLQ rebound. No HSM. EXTR: No c/c/e NEURO Normal gait.  PSYCH: Normally interactive. Conversant. Not depressed or anxious appearing.  Calm demeanor.   No results found for: WBC, HGB, HCT, PLT, GLUCOSE, CHOL, TRIG, HDL, LDLDIRECT, LDLCALC, ALT, AST, NA, K, CL, CREATININE, BUN, CO2, TSH, PSA, INR, GLUF, HGBA1C, MICROALBUR    Assessment & Plan:   Kelli Potter was seen today for abdominal pain.  Diagnoses and all orders for  this visit:  Acute appendicitis, unspecified acute appendicitis type Orders: -     POCT CBC  I am having Kelli Potter maintain her ibuprofen.  No orders of the defined types were placed in this encounter.   Various treatment options were  discussed with the patient including the IV therapy and transported to the hospital by ambulance. She declined transport by ambulance and was insisted on driving. I suggested she call her husband who can come and pick her up. The IV was not undertaken. It's my opinion that she has an acute appendicitis and requires CT scan the emergency room  Follow-up: No Follow-up on file.  Carmelina DaneAnderson, Rithwik Schmieg S, MD   Results for orders placed or performed in visit on 05/14/15  POCT CBC  Result Value Ref Range   WBC 9.2 4.6 - 10.2 K/uL   Lymph, poc 2.0 0.6 - 3.4   POC LYMPH PERCENT 22.1 10 - 50 %L   MID (cbc) 0.6 0 - 0.9   POC MID % 6.0 0 - 12 %M   POC Granulocyte 6.6 2 - 6.9   Granulocyte percent 71.9 37 - 80 %G   RBC 4.25 4.04 - 5.48 M/uL   Hemoglobin 12.3 12.2 - 16.2 g/dL   HCT, POC 82.937.6 (A) 56.237.7 - 47.9 %   MCV 88.4 80 - 97 fL   MCH, POC 28.9 27 - 31.2 pg   MCHC 32.7 31.8 - 35.4 g/dL   RDW, POC 13.013.4 %   Platelet Count, POC 321 142 - 424 K/uL   MPV 8.3 0 - 99.8 fL

## 2015-05-14 NOTE — Transfer of Care (Signed)
Immediate Anesthesia Transfer of Care Note  Patient: Kelli Potter  Procedure(s) Performed: Procedure(s): APPENDECTOMY LAPAROSCOPIC (N/A)  Patient Location: PACU  Anesthesia Type:General  Level of Consciousness: awake, alert , oriented and patient cooperative  Airway & Oxygen Therapy: Patient Spontanous Breathing and Patient connected to nasal cannula oxygen  Post-op Assessment: Report given to RN, Post -op Vital signs reviewed and stable, Patient moving all extremities and Patient moving all extremities X 4  Post vital signs: Reviewed and stable  Last Vitals:  Filed Vitals:   05/14/15 2148  BP: 126/66  Pulse: 83  Temp: 36.7 C  Resp: 11    Complications: No apparent anesthesia complications

## 2015-05-14 NOTE — ED Notes (Signed)
Pt came from MD office for lower abd pain since Sunday. Thought she had gas and took some medication but it hasn't relieved it. Hurts to lie down. Has had nausea and no appetite. No fever, diarrhea or vomiting.

## 2015-05-14 NOTE — H&P (Signed)
Kelli Potter is an 44 y.o. female.   Chief Complaint: rlq pain HPI: 45 yof who developed bilateral lower abdominal pain on Sunday that has progressed to be in rlq. This has worsened. Moving and laughing make it worse. Anorectic.  Some nausea. No emesis. Not been eating. No fevers.    Past Medical History  Diagnosis Date  . History of kidney infection   as a child  Past Surgical History  Procedure Laterality Date  . Kidney evaluation under anesthesia      Family History  Problem Relation Age of Onset  . Pulmonary fibrosis Mother   . Hypertension Father   . Eating disorder Sister   . Thyroid disease Brother     Graves Disease   Social History:  reports that she has never smoked. She has never used smokeless tobacco. She reports that she does not drink alcohol or use illicit drugs.  Allergies:  Allergies  Allergen Reactions  . Latex     meds none  Results for orders placed or performed during the hospital encounter of 05/14/15 (from the past 48 hour(s))  CBC with Differential     Status: None   Collection Time: 05/14/15  2:20 PM  Result Value Ref Range   WBC 9.0 4.0 - 10.5 K/uL   RBC 4.26 3.87 - 5.11 MIL/uL   Hemoglobin 12.4 12.0 - 15.0 g/dL   HCT 37.5 36.0 - 46.0 %   MCV 88.0 78.0 - 100.0 fL   MCH 29.1 26.0 - 34.0 pg   MCHC 33.1 30.0 - 36.0 g/dL   RDW 13.2 11.5 - 15.5 %   Platelets 306 150 - 400 K/uL   Neutrophils Relative % 71 43 - 77 %   Neutro Abs 6.4 1.7 - 7.7 K/uL   Lymphocytes Relative 21 12 - 46 %   Lymphs Abs 1.9 0.7 - 4.0 K/uL   Monocytes Relative 6 3 - 12 %   Monocytes Absolute 0.5 0.1 - 1.0 K/uL   Eosinophils Relative 2 0 - 5 %   Eosinophils Absolute 0.1 0.0 - 0.7 K/uL   Basophils Relative 0 0 - 1 %   Basophils Absolute 0.0 0.0 - 0.1 K/uL  Comprehensive metabolic panel     Status: None   Collection Time: 05/14/15  2:20 PM  Result Value Ref Range   Sodium 139 135 - 145 mmol/L   Potassium 3.9 3.5 - 5.1 mmol/L   Chloride 105 101 - 111 mmol/L    CO2 23 22 - 32 mmol/L   Glucose, Bld 99 65 - 99 mg/dL   BUN 8 6 - 20 mg/dL   Creatinine, Ser 0.83 0.44 - 1.00 mg/dL   Calcium 8.9 8.9 - 10.3 mg/dL   Total Protein 7.2 6.5 - 8.1 g/dL   Albumin 3.5 3.5 - 5.0 g/dL   AST 24 15 - 41 U/L   ALT 16 14 - 54 U/L   Alkaline Phosphatase 68 38 - 126 U/L   Total Bilirubin 0.6 0.3 - 1.2 mg/dL   GFR calc non Af Amer >60 >60 mL/min   GFR calc Af Amer >60 >60 mL/min    Comment: (NOTE) The eGFR has been calculated using the CKD EPI equation. This calculation has not been validated in all clinical situations. eGFR's persistently <60 mL/min signify possible Chronic Kidney Disease.    Anion gap 11 5 - 15  Lipase, blood     Status: None   Collection Time: 05/14/15  2:47 PM  Result Value Ref  Range   Lipase 22 22 - 51 U/L  Urinalysis, Routine w reflex microscopic     Status: None   Collection Time: 05/14/15  3:02 PM  Result Value Ref Range   Color, Urine YELLOW YELLOW   APPearance CLEAR CLEAR   Specific Gravity, Urine 1.008 1.005 - 1.030   pH 6.0 5.0 - 8.0   Glucose, UA NEGATIVE NEGATIVE mg/dL   Hgb urine dipstick NEGATIVE NEGATIVE   Bilirubin Urine NEGATIVE NEGATIVE   Ketones, ur NEGATIVE NEGATIVE mg/dL   Protein, ur NEGATIVE NEGATIVE mg/dL   Urobilinogen, UA 0.2 0.0 - 1.0 mg/dL   Nitrite NEGATIVE NEGATIVE   Leukocytes, UA NEGATIVE NEGATIVE    Comment: MICROSCOPIC NOT DONE ON URINES WITH NEGATIVE PROTEIN, BLOOD, LEUKOCYTES, NITRITE, OR GLUCOSE <1000 mg/dL.  Wet prep, genital     Status: Abnormal   Collection Time: 05/14/15  3:10 PM  Result Value Ref Range   Yeast Wet Prep HPF POC FEW (A) NONE SEEN   Trich, Wet Prep NONE SEEN NONE SEEN   Clue Cells Wet Prep HPF POC FEW (A) NONE SEEN   WBC, Wet Prep HPF POC MODERATE (A) NONE SEEN  POC urine preg, ED     Status: None   Collection Time: 05/14/15  3:45 PM  Result Value Ref Range   Preg Test, Ur NEGATIVE NEGATIVE    Comment:        THE SENSITIVITY OF THIS METHODOLOGY IS >24 mIU/mL    Ct  Abdomen Pelvis W Contrast  05/14/2015   CLINICAL DATA:  RLQ PAIN X 3 DAYS, NEG FEVER, NAUSEA,  EXAM: CT ABDOMEN AND PELVIS WITH CONTRAST  TECHNIQUE: Multidetector CT imaging of the abdomen and pelvis was performed using the standard protocol following bolus administration of intravenous contrast.  CONTRAST:  133m OMNIPAQUE IOHEXOL 300 MG/ML  SOLN  COMPARISON:  05/09/2004  FINDINGS: Minimal dependent atelectasis posteriorly in the visualized lung bases. Unremarkable liver, nondilated gallbladder, spleen, adrenal glands, pancreas, kidneys. Aorta and portal vein unremarkable. Stomach, small bowel, and colon are nondilated. The appendix is thick walled, dilated to a diameter of 11 mm, with some adjacent inflammatory/edematous change. No extraluminal gas or fluid collection is identified. Urinary bladder physiologically distended. Uterus and adnexal regions unremarkable. Small amount of fluid in pelvic cul-de-sac. No free air. No hydronephrosis. Minimal degenerative changes in the lumbar spine.  IMPRESSION: 1. Acute appendicitis without evidence of perforation or abscess. Critical Value/emergent results were called by telephone at the time of interpretation on 05/14/2015 at 6:46 pm to Dr. POlene Floss who verbally acknowledged these results.   Electronically Signed   By: DLucrezia EuropeM.D.   On: 05/14/2015 18:47    Review of Systems  Constitutional: Negative for fever and chills.  Respiratory: Negative for cough and shortness of breath.   Cardiovascular: Negative for chest pain.  Gastrointestinal: Positive for nausea and abdominal pain. Negative for vomiting.    Blood pressure 116/62, pulse 60, temperature 98.1 F (36.7 C), temperature source Oral, resp. rate 18, height 5' 7"  (1.702 m), weight 97.16 kg (214 lb 3.2 oz), last menstrual period 05/05/2015, SpO2 97 %. Physical Exam  Vitals reviewed. Constitutional: She appears well-developed and well-nourished.  Eyes: No scleral icterus.  Neck: Neck supple.   Cardiovascular: Normal rate, regular rhythm and normal heart sounds.   Respiratory: Effort normal and breath sounds normal. She has no wheezes. She has no rales.  GI: Soft. Bowel sounds are normal. She exhibits no distension. There is tenderness in the right lower quadrant.  There is tenderness at McBurney's point. No hernia.     Assessment/Plan Acute appendicitis  We discussed pathophysiology of appendicitis. Does not appear perforated.  Will plan on lap appy tonight. abx written for.  Risks discussed. Possibilities depending on perforated/nonperforated discussed in terms of hospital stay  [facility] 05/14/2015, 7:18 PM

## 2015-05-15 ENCOUNTER — Encounter (HOSPITAL_COMMUNITY): Payer: Self-pay | Admitting: General Surgery

## 2015-05-15 LAB — HIV ANTIBODY (ROUTINE TESTING W REFLEX): HIV Screen 4th Generation wRfx: NONREACTIVE

## 2015-05-15 MED ORDER — OXYCODONE-ACETAMINOPHEN 5-325 MG PO TABS: 1.0000 | ORAL_TABLET | ORAL | Status: DC | PRN

## 2015-05-15 NOTE — Discharge Summary (Signed)
Physician Discharge Summary  Patient ID: [name] MRN: [id number] DOB/AGE: 1971-11-04 44 y.o.  Admit date: 05/14/2015 Discharge date: 05/15/2015  Discharge Diagnoses Patient Active Problem List   Diagnosis Date Noted  . S/P laparoscopic appendectomy 05/14/2015  . BMI 33.0-33.9,adult 03/26/2015  . History of abnormal cervical Pap smear 03/26/2015    Consultants None   Procedures 5/17 -- Laparoscopic appendectomy by Dr. Emelia LoronMatthew [place]   HPI: [name] developed bilateral lower abdominal pain on Sunday that progressed to be in the right lower quadrant. This worsened. Moving and laughing made it worse. She was anorectic. Some nausea but no emesis. Had not been eating. No fevers.A CT scan of the abdomens showed acute appendicitis and she was admitted and taken to surgery.    Hospital Course: The patient had an uncomplicated laparoscopic appendectomy. By the following morning she was tolerating a regular diet and her pain was acceptably controlled. She was discharged home in good condition.     Medication List    TAKE these medications        ibuprofen 100 MG chewable tablet  Commonly known as:  ADVIL,MOTRIN  Chew by mouth every 8 (eight) hours as needed.     multivitamin with minerals tablet  Take 1 tablet by mouth daily.     naproxen sodium 220 MG tablet  Commonly known as:  ANAPROX  Take 220 mg by mouth 2 (two) times daily with a meal.     oxyCODONE-acetaminophen 5-325 MG per tablet  Commonly known as:  ROXICET  Take 1-2 tablets by mouth every 4 (four) hours as needed (Pain).            Follow-up Information    Follow up with [facility] [place] On 06/04/2015.   Why:  2:30PM   Contact information:   [address] [place] [phone]       Signed: Freeman CaldronMichael J. Rylee Nuzum, PA-C Pager: [phone] 05/15/2015, 10:23 AM

## 2015-05-15 NOTE — Discharge Instructions (Signed)
[facility] ______[facility] °LAPAROSCOPIC SURGERY: POST OP INSTRUCTIONS °Always review your discharge instruction sheet given to you by the facility where your surgery was performed. °IF YOU HAVE DISABILITY OR FAMILY LEAVE FORMS, YOU MUST BRING THEM TO THE OFFICE FOR PROCESSING.   °DO NOT GIVE THEM TO YOUR DOCTOR. ° °1. A prescription for pain medication may be given to you upon discharge.  Take your pain medication as prescribed, if needed.  If narcotic pain medicine is not needed, then you may take acetaminophen (Tylenol) or ibuprofen (Advil) as needed. °2. Take your usually prescribed medications unless otherwise directed. °3. If you need a refill on your pain medication, please contact your pharmacy.  They will contact our office to request authorization. Prescriptions will not be filled after 5pm or on week-ends. °4. You should follow a light diet the first few days after arrival home, such as soup and crackers, etc.  Be sure to include lots of fluids daily. °5. Most patients will experience some swelling and bruising in the area of the incisions.  Ice packs will help.  Swelling and bruising can take several days to resolve.  °6. It is common to experience some constipation if taking pain medication after surgery.  Increasing fluid intake and taking a stool softener (such as Colace) will usually help or prevent this problem from occurring.  A mild laxative (Milk of Magnesia or Miralax) should be taken according to package instructions if there are no bowel movements after 48 hours. °7. Unless discharge instructions indicate otherwise, you may remove your bandages 24-48 hours after surgery, and you may shower at that time.  You may have steri-strips (small skin tapes) in place directly over the incision.  These strips should be left on the skin for 7-10 days.  If your surgeon used skin glue on the incision, you may shower in 24 hours.  The glue will flake off over the next 2-3 weeks.  Any sutures or  staples will be removed at the office during your follow-up visit. °8. ACTIVITIES:  You may resume regular (light) daily activities beginning the next day--such as daily self-care, walking, climbing stairs--gradually increasing activities as tolerated.  You may have sexual intercourse when it is comfortable.  Refrain from any heavy lifting or straining until approved by your doctor. °a. You may drive when you are no longer taking prescription pain medication, you can comfortably wear a seatbelt, and you can safely maneuver your car and apply brakes. °b. RETURN TO WORK:  __________________________________________________________ °9. You should see your doctor in the office for a follow-up appointment approximately 2-3 weeks after your surgery.  Make sure that you call for this appointment within a day or two after you arrive home to insure a convenient appointment time. °10. OTHER INSTRUCTIONS: __________________________________________________________________________________________________________________________ __________________________________________________________________________________________________________________________ °WHEN TO CALL YOUR DOCTOR: °1. Fever over 101.0 °2. Inability to urinate °3. Continued bleeding from incision. °4. Increased pain, redness, or drainage from the incision. °5. Increasing abdominal pain ° °The clinic staff is available to answer your questions during regular business hours.  Please don’t hesitate to call and ask to speak to one of the nurses for clinical concerns.  If you have a medical emergency, go to the nearest emergency room or call 911.  A surgeon from [facility] is always on call at the hospital. °[address], [address], [place], [place]  [address] ? [address] [address], [place],    [address] °[phone] ? [phone] ? FAX [phone] °Web site:   [email or web address] °

## 2015-05-15 NOTE — Anesthesia Postprocedure Evaluation (Signed)
Anesthesia Post Note  Patient: Kelli Potter  Procedure(s) Performed: Procedure(s) (LRB): APPENDECTOMY LAPAROSCOPIC (N/A)  Anesthesia type: general  Patient location: PACU  Post pain: Pain level controlled  Post assessment: Patient's Cardiovascular Status Stable  Last Vitals:  Filed Vitals:   05/14/15 2246  BP: 119/63  Pulse:   Temp: 36.8 C  Resp:     Post vital signs: Reviewed and stable  Level of consciousness: awake  Complications: No apparent anesthesia complications. Pt is not on any birth control. Husband vasectomy. Thus no hormonal binding implications of Sugammadex.

## 2015-05-15 NOTE — Progress Notes (Signed)
Discussed discharge summary with patient. Reviewed all medications with patient. Patient received Rx. Patient has no questions and ready for discharge.

## 2015-05-15 NOTE — Progress Notes (Signed)
Patient ID: Kelli Potter, female   DOB: 06-Dec-1971, 44 y.o.   MRN: [id number]  LOS: 2 days POD #1  Subjective: Sore but otherwise ok. Denies N/V.   Objective: Vital signs in last 24 hours: Temp:  [97.7 F (36.5 C)-98.3 F (36.8 C)] 98.1 F (36.7 C) (05/18 0556) Pulse Rate:  [55-85] 55 (05/18 0556) Resp:  [7-18] 14 (05/18 0556) BP: (99-146)/(54-80) 99/57 mmHg (05/18 0556) SpO2:  [94 %-100 %] 95 % (05/18 0556) Weight:  [96.979 kg (213 lb 12.8 oz)-97.16 kg (214 lb 3.2 oz)] 97.16 kg (214 lb 3.2 oz) (05/17 1412)    Physical Exam General appearance: alert and no distress Resp: clear to auscultation bilaterally Cardio: regular rate and rhythm GI: Soft, +BS, port sites WNL   Assessment/Plan: S/p lap appy -- Home today    Freeman CaldronMichael J. Avalina Benko, PA-C Pager: [phone] 05/15/2015

## 2015-05-23 ENCOUNTER — Telehealth: Payer: Self-pay

## 2015-05-23 NOTE — Telephone Encounter (Signed)
Spoke with pt, she is supposed to be going to the beach this weekend. Can she go into the pool or ocean?. She is 2 weeks out of surgery. She could not get in touch with her surgeon and would like some advice. Can we help her?

## 2015-05-23 NOTE — Telephone Encounter (Signed)
Patient has questions regarding her surgery and upcoming beach trip.    Was on pain medication, has stopped.   [phone]

## 2015-05-24 NOTE — Telephone Encounter (Signed)
lmom of below message. 

## 2015-05-24 NOTE — Telephone Encounter (Signed)
I would think as long as her surgical incisions are healed, that should be fine.

## 2015-06-04 ENCOUNTER — Ambulatory Visit (INDEPENDENT_AMBULATORY_CARE_PROVIDER_SITE_OTHER): Payer: BLUE CROSS/BLUE SHIELD | Admitting: Physician Assistant

## 2015-06-04 ENCOUNTER — Encounter: Payer: Self-pay | Admitting: Physician Assistant

## 2015-06-04 VITALS — BP 132/84 | HR 74 | Temp 98.0°F | Resp 16 | Ht 67.0 in | Wt 207.6 lb

## 2015-06-04 DIAGNOSIS — Z124 Encounter for screening for malignant neoplasm of cervix: Secondary | ICD-10-CM | POA: Diagnosis not present

## 2015-06-04 DIAGNOSIS — Z0000 Encounter for general adult medical examination without abnormal findings: Secondary | ICD-10-CM

## 2015-06-04 DIAGNOSIS — Z13220 Encounter for screening for lipoid disorders: Secondary | ICD-10-CM

## 2015-06-04 DIAGNOSIS — Z1329 Encounter for screening for other suspected endocrine disorder: Secondary | ICD-10-CM | POA: Diagnosis not present

## 2015-06-04 LAB — LIPID PANEL
CHOL/HDL RATIO: 4.9 ratio
CHOLESTEROL: 152 mg/dL (ref 0–200)
HDL: 31 mg/dL — ABNORMAL LOW (ref >=46)
LDL Cholesterol: 102 mg/dL — ABNORMAL HIGH (ref 0–99)
Triglycerides: 95 mg/dL (ref <150)
VLDL: 19 mg/dL (ref 0–40)

## 2015-06-04 LAB — TSH: TSH: 3.799 u[IU]/mL (ref 0.350–4.500)

## 2015-06-04 NOTE — Patient Instructions (Signed)
I will contact you with your lab results as soon as they are available.   If you have not heard from me in 2 weeks, please contact me.  The fastest way to get your results is to register for My Chart (see the instructions on the last page of this printout).  Keeping You Healthy  Get These Tests 1. Blood Pressure- Have your blood pressure checked once a year by your health care provider.  Normal blood pressure is 120/80. 2. Weight- Have your body mass index (BMI) calculated to screen for obesity.  BMI is measure of body fat based on height and weight.  You can also calculate your own BMI at www.nhlbisupport.com/bmi/. 3. Cholesterol- Have your cholesterol checked every 5 years starting at age 20 then yearly starting at age 45. 4. Chlamydia, HIV, and other sexually transmitted diseases- Get screened every year until age 25, then within three months of each new sexual provider. 5. Pap Test - Every 1-5 years; discuss with your health care provider. 6. Mammogram- Every 1-2 years starting at age 40--50  Take these medicines  Calcium with Vitamin D-Your body needs 1200 mg of Calcium each day and 800-1000 IU of Vitamin D daily.  Your body can only absorb 500 mg of Calcium at a time so Calcium must be taken in 2 or 3 divided doses throughout the day.  Multivitamin with folic acid- Once daily if it is possible for you to become pregnant.  Get these Immunizations  Gardasil-Series of three doses; prevents HPV related illness such as genital warts and cervical cancer.  Menactra-Single dose; prevents meningitis.  Tetanus shot- Every 10 years.  Flu shot-Every year.  Take these steps 1. Do not smoke-Your healthcare provider can help you quit.  For tips on how to quit go to www.smokefree.gov or call 1-800 QUITNOW. 2. Be physically active- Exercise 5 days a week for at least 30 minutes.  If you are not already physically active, start slow and gradually work up to 30 minutes of moderate physical  activity.  Examples of moderate activity include walking briskly, dancing, swimming, bicycling, etc. 3. Breast Cancer- A self breast exam every month is important for early detection of breast cancer.  For more information and instruction on self breast exams, ask your healthcare provider or www.womenshealth.gov/faq/breast-self-exam.cfm. 4. Eat a healthy diet- Eat a variety of healthy foods such as fruits, vegetables, whole grains, low fat milk, low fat cheeses, yogurt, lean meats, poultry and fish, beans, nuts, tofu, etc.  For more information go to www. Thenutritionsource.org 5. Drink alcohol in moderation- Limit alcohol intake to one drink or less per day. Never drink and drive. 6. Depression- Your emotional health is as important as your physical health.  If you're feeling down or losing interest in things you normally enjoy please talk to your healthcare provider about being screened for depression. 7. Dental visit- Brush and floss your teeth twice daily; visit your dentist twice a year. 8. Eye doctor- Get an eye exam at least every 2 years. 9. Helmet use- Always wear a helmet when riding a bicycle, motorcycle, rollerblading or skateboarding. 10. Safe sex- If you may be exposed to sexually transmitted infections, use a condom. 11. Seat belts- Seat belts can save your live; always wear one. 12. Smoke/Carbon Monoxide detectors- These detectors need to be installed on the appropriate level of your home. Replace batteries at least once a year. 13. Skin cancer- When out in the sun please cover up and use sunscreen 15 SPF   or higher. 14. Violence- If anyone is threatening or hurting you, please tell your healthcare provider.        

## 2015-06-04 NOTE — Progress Notes (Signed)
Subjective:    Patient ID: Kelli Potter, female    DOB: 1971-10-21, 44 y.o.   MRN: [id number]  PCP: Missie Gehrig, PA-C  Chief Complaint  Patient presents with  . Annual Exam    HPI  Presents for annual exam. Last pap:  Immunization History  Administered Date(s) Administered  . Influenza Split 09/05/2012  . Td 03/26/2015  . Tdap 12/28/2004     Review of Systems  Constitutional: Negative.   HENT: Positive for congestion (this is what I get. It happens spring and fall. In the spring, it's from the hay. Uses expectorant and PRN inhaler). Negative for dental problem, drooling, ear discharge, ear pain, facial swelling, hearing loss, mouth sores, nosebleeds, postnasal drip, rhinorrhea, sinus pressure, sneezing, sore throat, tinnitus, trouble swallowing and voice change.   Eyes: Negative.   Respiratory: Positive for chest tightness (due to allergies).   Cardiovascular: Negative.   Gastrointestinal: Positive for abdominal pain (Appendectomy 3 weeks ago; still having pain, LEFT upper abdomen. Has follow-up with surgery today.). Negative for nausea, vomiting, diarrhea, constipation, blood in stool, abdominal distention, anal bleeding and rectal pain.  Endocrine: Negative.   Genitourinary: Negative.   Musculoskeletal: Negative.   Skin: Negative.   Allergic/Immunologic: Positive for environmental allergies. Negative for food allergies and immunocompromised state.  Neurological: Negative.   Hematological: Negative.   Psychiatric/Behavioral: Negative.        Objective:   Physical Exam  Constitutional: She is oriented to person, place, and time. Vital signs are normal. She appears well-developed and well-nourished. She is active and cooperative. No distress.  BP 132/84 mmHg  Pulse 74  Temp(Src) 98 F (36.7 C) (Oral)  Resp 16  Ht  (1.702 m)  Wt 207 lb 9.6 oz (94.167 kg)  BMI 32.51 kg/m2  SpO2 100%  LMP 05/05/2015   HENT:  Head: Normocephalic and atraumatic.    Right Ear: Hearing, tympanic membrane, external ear and ear canal normal. No foreign bodies.  Left Ear: Hearing, tympanic membrane, external ear and ear canal normal. No foreign bodies.  Nose: Nose normal.  Mouth/Throat: Uvula is midline, oropharynx is clear and moist and mucous membranes are normal. No oral lesions. Normal dentition. No dental abscesses or uvula swelling. No oropharyngeal exudate.  Eyes: Conjunctivae, EOM and lids are normal. Pupils are equal, round, and reactive to light. Right eye exhibits no discharge. Left eye exhibits no discharge. No scleral icterus.  Fundoscopic exam:      The right eye shows no arteriolar narrowing, no AV nicking, no exudate, no hemorrhage and no papilledema.       The left eye shows no arteriolar narrowing, no AV nicking, no exudate, no hemorrhage and no papilledema.  Neck: Trachea normal, normal range of motion and full passive range of motion without pain. Neck supple. No spinous process tenderness and no muscular tenderness present. No thyroid mass and no thyromegaly present.  Cardiovascular: Normal rate, regular rhythm, normal heart sounds, intact distal pulses and normal pulses.   Pulmonary/Chest: Effort normal and breath sounds normal. She exhibits no tenderness and no retraction. Right breast exhibits no inverted nipple, no mass, no nipple discharge, no skin change and no tenderness. Left breast exhibits no inverted nipple, no mass, no nipple discharge, no skin change and no tenderness. Breasts are symmetrical.  Abdominal: Soft. Normal appearance and bowel sounds are normal. She exhibits no distension and no mass. There is no hepatosplenomegaly. There is no tenderness. There is no rigidity, no rebound, no guarding, no CVA tenderness, no  tenderness at McBurney's point and negative Murphy's sign. No hernia. Hernia confirmed negative in the right inguinal area and confirmed negative in the left inguinal area.    Genitourinary: Rectum normal, vagina  normal and uterus normal. Rectal exam shows no external hemorrhoid and no fissure. No breast swelling, tenderness, discharge or bleeding. Pelvic exam was performed with patient supine. No labial fusion. There is no rash, tenderness, lesion or injury on the right labia. There is no rash, tenderness, lesion or injury on the left labia. Cervix exhibits no motion tenderness, no discharge and no friability. Right adnexum displays no mass, no tenderness and no fullness. Left adnexum displays no mass, no tenderness and no fullness. No erythema, tenderness or bleeding in the vagina. No foreign body around the vagina. No signs of injury around the vagina. No vaginal discharge found.  Musculoskeletal: She exhibits no edema or tenderness.       Cervical back: Normal.       Thoracic back: Normal.       Lumbar back: Normal.  Lymphadenopathy:       Head (right side): No tonsillar, no preauricular, no posterior auricular and no occipital adenopathy present.       Head (left side): No tonsillar, no preauricular, no posterior auricular and no occipital adenopathy present.    She has no cervical adenopathy.    She has no axillary adenopathy.       Right: No inguinal and no supraclavicular adenopathy present.       Left: No inguinal and no supraclavicular adenopathy present.  Neurological: She is alert and oriented to person, place, and time. She has normal strength and normal reflexes. No cranial nerve deficit. She exhibits normal muscle tone. Coordination and gait normal.  Skin: Skin is warm, dry and intact. No rash noted. She is not diaphoretic. No cyanosis or erythema. Nails show no clubbing.  Psychiatric: She has a normal mood and affect. Her speech is normal and behavior is normal. Judgment and thought content normal.   Labs from recent hospitalization reviewed, including CBC, CMET, HIV.       Assessment & Plan:  1. Annual physical exam Age appropriate anticipatory guidance provided.  2. Screening for  cervical cancer - Pap IG and HPV (high risk) DNA detection  3. Screening for hyperlipidemia - Lipid panel  4. Screening for thyroid disorder - TSH   Proceed to surgery follow-up later today as planned. Unclear if the area of tenderness is inflammatory, reactive to the recent laparoscopic surgery for appendicitis or a complication. Appropriate for surgery to evaluate and advise.   Kelli Brashelle S. Yeraldine Forney, PA-C Physician Assistant-Certified [facility]

## 2015-06-06 LAB — PAP IG AND HPV HIGH-RISK: HPV DNA HIGH RISK: NOT DETECTED

## 2015-06-22 ENCOUNTER — Ambulatory Visit (INDEPENDENT_AMBULATORY_CARE_PROVIDER_SITE_OTHER): Payer: BLUE CROSS/BLUE SHIELD | Admitting: Physician Assistant

## 2015-06-22 VITALS — BP 118/74 | HR 79 | Temp 98.3°F | Resp 18 | Ht 68.75 in | Wt 204.8 lb

## 2015-06-22 DIAGNOSIS — F411 Generalized anxiety disorder: Secondary | ICD-10-CM | POA: Diagnosis not present

## 2015-06-22 DIAGNOSIS — G479 Sleep disorder, unspecified: Secondary | ICD-10-CM

## 2015-06-22 MED ORDER — ESCITALOPRAM OXALATE 10 MG PO TABS: 10.0000 mg | ORAL_TABLET | Freq: Every day | ORAL | Status: DC

## 2015-06-22 MED ORDER — HYDROXYZINE HCL 25 MG PO TABS: 12.5000 mg | ORAL_TABLET | Freq: Every evening | ORAL | Status: DC | PRN

## 2015-06-22 NOTE — Progress Notes (Signed)
Patient ID: Kelli Potter, female    DOB: [birth year], 44 y.o.   MRN: [id number]  PCP: JEFFERY,CHELLE, PA-C   Subjective:  HPI Pt is a 44 y/o female presenting to clinic for evaluation of anxiety.  She states that she has been arguing with her husband more and more over the last 6 months and he has been telling her that she has a problem and needed to seek medical care, but she didn't think she had a problem. The fights have not been physical and she feels safe at home, but the arguing got to be too much and she left her husband 3 days ago to stay somewhere else. She does not want to end her marriage, she says she just couldn't be in the house anymore and she had to get away. Then, last night, her friend thought she was having a heart attack, the patient took her friend to the ER, and found herself worrying more about her husband not texting her back rather than her friend being in the ER with a possible heart attack. That is when she realized that she had a problem, and decided to seek our care this morning.   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 She says she finds herself worrying about "stupid stuff" like her husband not texting her back or things she needs to get done. She says she feels agitated when she doesn't feel things are going the way they should, and she occasionally has panic attacks during her arguments with her husband in which her heart races and she experiences chest tightness and the feeling that she cannot breathe (3 times in the last 6 months). After the stressful events over the last 3 days, she has had a decreased appetite, and has felt fatigued because she is not sleeping well. She finds herself waking up and not being able to fall back asleep because she is worrying about what she did wrong. She has tried relieving stress by taking long baths  and going for runs. She denies depressed mood, feelings of hopelessness, anhedonia, and thoughts or plans of hurting herself or others. Of note, Pt had an unexpected appendectomy last month, which has caused her additional stress because there has been communication issues with her surgeons. Additionally, her friend's husband was recently in a plane crash and has been in the ICU for the last month. She has been very involved in helping her friend and taking her to the hospital a lot, which has also caused additional stress.    Review of Systems  Constitutional: Positive for appetite change (decreased appetitie over last few days) and fatigue (Not sleeping well). Negative for fever, chills, diaphoresis, activity change and unexpected weight change.  Respiratory: Positive for chest tightness (during panic attacks) and shortness of breath (with panic attack). Negative for apnea, cough, choking, wheezing and stridor.   Cardiovascular: Positive for palpitations (with panic attacks). Negative for chest pain and leg swelling.  Gastrointestinal: Positive for diarrhea (with increased stress). Negative for nausea, vomiting, abdominal pain, constipation, blood in stool, abdominal distention, anal bleeding and rectal pain.  Endocrine: Negative.   Skin: Negative.   Neurological: Negative.   Psychiatric/Behavioral: Positive for sleep disturbance and agitation. Negative for suicidal ideas, hallucinations, behavioral problems, confusion, self-injury, dysphoric mood and decreased concentration. The patient is nervous/anxious. The patient is not hyperactive.  Patient Active Problem List   Diagnosis Date Noted  . S/P laparoscopic appendectomy 05/14/2015  . BMI 32.0-32.9,adult 03/26/2015  . History of abnormal cervical Pap smear 03/26/2015    Past Medical History  Diagnosis Date  . History of kidney infection "as a child"  . Allergy   . Asthma     Prior to Admission medications   Medication Sig Start  Date End Date Taking? Authorizing Provider  ibuprofen (ADVIL,MOTRIN) 100 MG chewable tablet Chew by mouth every 8 (eight) hours as needed.   Yes Historical Provider, MD  Multiple Vitamins-Minerals (MULTIVITAMIN WITH MINERALS) tablet Take 1 tablet by mouth daily.   Yes Historical Provider, MD  naproxen sodium (ANAPROX) 220 MG tablet Take 220 mg by mouth 2 (two) times daily with a meal.    Historical Provider, MD  oxyCODONE-acetaminophen (ROXICET) 5-325 MG per tablet Take 1-2 tablets by mouth every 4 (four) hours as needed (Pain). Patient not taking: Reported on 06/04/2015 05/15/15   Freeman Caldron, PA-C    Allergies  Allergen Reactions  . Latex     Past Medical, Surgical Family and Social History reviewed and updated.   Objective:   Vitals: BP 118/74 mmHg  Pulse 79  Temp(Src) 98.3 F (36.8 C) (Oral)  Resp 18  Ht 5' 8.75" (1.746 m)  Wt 204 lb 12.8 oz (92.897 kg)  BMI 30.47 kg/m2  SpO2 98%  LMP 05/31/2015   Physical Exam  Constitutional: She is oriented to person, place, and time. She appears well-developed and well-nourished. She is cooperative. No distress.  HENT:  Head: Normocephalic and atraumatic.  Eyes: Conjunctivae are normal. Pupils are equal, round, and reactive to light.  Neck: Trachea normal and phonation normal. Neck supple. No thyroid mass and no thyromegaly present.  Cardiovascular: Normal rate, regular rhythm and normal heart sounds.  Exam reveals no gallop and no friction rub.   No murmur heard. Pulmonary/Chest: Effort normal and breath sounds normal. No respiratory distress. She has no wheezes. She has no rales.  Lymphadenopathy:       Head (right side): No submental, no submandibular, no tonsillar, no preauricular, no posterior auricular and no occipital adenopathy present.       Head (left side): No submental, no submandibular, no tonsillar, no preauricular, no posterior auricular and no occipital adenopathy present.    She has no cervical adenopathy.    Neurological: She is alert and oriented to person, place, and time.  Skin: Skin is warm, dry and intact.  Psychiatric: Her speech is normal and behavior is normal. Judgment and thought content normal. Her mood appears anxious. Her affect is not angry, not blunt, not labile and not inappropriate. Cognition and memory are normal. She does not exhibit a depressed mood.  Pt was tearful in room when discussing recent stressful events.     Assessment & Plan:   Kelli Potter was seen today for anxiety.  Diagnoses and all orders for this visit:  Anxiety state and Sleep disturbance -     Pt is under a lot of stress and feeling anxious all the time.  -     Start her on an SSRI with plan to have her well enough in a year to stop taking, as she does not want to be on medication long term.  -      Once pt starts feeling well enough, we recommend her seeking out therapy to deal with her anxiety more effectively, which may help her discontinue the medication sooner and help her deal  with her anxiety on her own. -     Pt should take Vistaril as needed at night to help her sleep. She may need to take it more over the next few weeks until the Lexapro reaches its maximum effect.  -     F/u in 4 weeks to evaluate effectiveness of the current treatment plan. Pt should return sooner if symptoms worsen.  Orders: -     escitalopram (LEXAPRO) 10 MG tablet; Take 1 tablet (10 mg total) by mouth daily. -     hydrOXYzine (ATARAX/VISTARIL) 25 MG tablet; Take 0.5-3 tablets (12.5-75 mg total) by mouth at bedtime as needed for itching.   Erskin Zinda, PA-S [facility] 06/22/2015 8:39 AM

## 2015-06-22 NOTE — Progress Notes (Signed)
Patient ID: Kelli Potter, female    DOB: 06/10/1971, 44 y.o.   MRN: [id number]  PCP: Avigail Pilling, PA-C  Subjective:   Chief Complaint  Patient presents with  . Anxiety    Extreme anxiety. Pt. noticed it 3 days ago. Husband noticed it 6 months ago.     HPI Presents for evaluation of anxiety.   She states that she has been arguing with her husband more and more over the last 6 months and he has been telling her that she has a problem and needed to seek medical care, but she didn't think she had a problem. The fights have not been physical and she feels safe at home, but the arguing got to be too much and she left her husband 3 days ago to stay somewhere else. She does not want to end her marriage, she says she just couldn't be in the house anymore and she had to get away. She believes strongly in her marriage vows and is very upset even thinking about divorce. Her husband doesn't think they need counseling, just that she is being too sensitive. She's met with their pastor, and worked on improving communication, but her husband is not will to change how he communicates with her, even knowing that it is contributing to her unhappiness.   Then, last night, her friend thought she was having a heart attack, the patient took her friend to the ER, and found herself worrying more about her husband not texting her back rather than her friend being in the ER with a possible heart attack. That is when she realized that she had a problem, and decided to seek our care this morning.   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 She says she finds herself worrying about "stupid stuff" like her husband not texting her back or things she needs to get done. She says she feels agitated when she doesn't feel things are going the way they should, and she occasionally has panic  attacks during her arguments with her husband in which her heart races and she experiences chest tightness and the feeling that she cannot breathe (3 times in the last 6 months).   After the stressful events over the last 3 days, she has had a decreased appetite, and has felt fatigued because she is not sleeping well. She finds herself waking up and not being able to fall back asleep because she is worrying about what she did wrong. She has tried relieving stress by taking long baths and going for runs. She denies depressed mood, feelings of hopelessness, anhedonia, and thoughts or plans of hurting herself or others. Of note, Pt had an unexpected appendectomy last month, which has caused her additional stress because there has been communication issues with her surgeons.   Additionally, her friend's husband was recently in a plane crash and has been in the ICU for the last month. She has been very involved in helping her friend and taking her to the hospital a lot, which has also caused additional stress.    Review of Systems Constitutional: Positive for appetite change (decreased appetitie over last few days) and fatigue (Not sleeping well). Negative for fever, chills, diaphoresis, activity change and unexpected weight change.  Respiratory: Positive for chest tightness (during panic attacks) and shortness of breath (with panic attack). Negative for apnea,  cough, choking, wheezing and stridor.  Cardiovascular: Positive for palpitations (with panic attacks). Negative for chest pain and leg swelling.  Gastrointestinal: Positive for diarrhea (with increased stress). Negative for nausea, vomiting, abdominal pain, constipation, blood in stool, abdominal distention, anal bleeding and rectal pain.  Endocrine: Negative.  Skin: Negative.  Neurological: Negative.  Psychiatric/Behavioral: Positive for sleep disturbance and agitation. Negative for suicidal ideas, hallucinations, behavioral problems, confusion,  self-injury, dysphoric mood and decreased concentration. The patient is nervous/anxious. The patient is not hyperactive.      Patient Active Problem List   Diagnosis Date Noted  . S/P laparoscopic appendectomy 05/14/2015  . BMI 32.0-32.9,adult 03/26/2015  . History of abnormal cervical Pap smear 03/26/2015     Prior to Admission medications   Medication Sig Start Date End Date Taking? Authorizing Provider  ibuprofen (ADVIL,MOTRIN) 100 MG chewable tablet Chew by mouth every 8 (eight) hours as needed.   Yes Historical Provider, MD  Multiple Vitamins-Minerals (MULTIVITAMIN WITH MINERALS) tablet Take 1 tablet by mouth daily.   Yes Historical Provider, MD  escitalopram (LEXAPRO) 10 MG tablet Take 1 tablet (10 mg total) by mouth daily. 06/22/15   Coe Angelos, PA-C  hydrOXYzine (ATARAX/VISTARIL) 25 MG tablet Take 0.5-3 tablets (12.5-75 mg total) by mouth at bedtime as needed for itching. 06/22/15   Regine Christian, PA-C  naproxen sodium (ANAPROX) 220 MG tablet Take 220 mg by mouth 2 (two) times daily with a meal.    Historical Provider, MD     Allergies  Allergen Reactions  . Latex        Objective:  Physical Exam  Constitutional: She is oriented to person, place, and time. She appears well-developed and well-nourished. She is active and cooperative. No distress.  BP 118/74 mmHg  Pulse 79  Temp(Src) 98.3 F (36.8 C) (Oral)  Resp 18  Ht 5' 8.75" (1.746 m)  Wt 204 lb 12.8 oz (92.897 kg)  BMI 30.47 kg/m2  SpO2 98%  LMP 05/31/2015   Eyes: Conjunctivae are normal.  Pulmonary/Chest: Effort normal.  Neurological: She is alert and oriented to person, place, and time.  Skin: Skin is warm and dry.  Psychiatric: She has a normal mood and affect. Her speech is normal and behavior is normal. She expresses no homicidal and no suicidal ideation.  Tearful, appropriate.           Assessment & Plan:  1. Anxiety state Trial of SSRI. Anticipatory guidance. Counseled. Recommend  psychotherapy. She's not ready yet. - escitalopram (LEXAPRO) 10 MG tablet; Take 1 tablet (10 mg total) by mouth daily.  Dispense: 90 tablet; Refill: 3  2. Sleep disturbance Due to #1. Expect resolution as anxiety improves. - hydrOXYzine (ATARAX/VISTARIL) 25 MG tablet; Take 0.5-3 tablets (12.5-75 mg total) by mouth at bedtime as needed for itching.  Dispense: 90 tablet; Refill: 0   Fara Chute, PA-C Physician Assistant-Certified [facility]

## 2016-04-21 ENCOUNTER — Encounter: Payer: Self-pay | Admitting: Physician Assistant

## 2016-04-21 ENCOUNTER — Ambulatory Visit (INDEPENDENT_AMBULATORY_CARE_PROVIDER_SITE_OTHER): Payer: BLUE CROSS/BLUE SHIELD | Admitting: Physician Assistant

## 2016-04-21 VITALS — BP 136/74 | HR 72 | Resp 16 | Ht 67.5 in | Wt 174.0 lb

## 2016-04-21 DIAGNOSIS — IMO0001 Reserved for inherently not codable concepts without codable children: Secondary | ICD-10-CM

## 2016-04-21 DIAGNOSIS — R350 Frequency of micturition: Secondary | ICD-10-CM

## 2016-04-21 DIAGNOSIS — R300 Dysuria: Secondary | ICD-10-CM

## 2016-04-21 DIAGNOSIS — M545 Low back pain: Secondary | ICD-10-CM | POA: Diagnosis not present

## 2016-04-21 LAB — POCT URINALYSIS DIP (MANUAL ENTRY)
Bilirubin, UA: NEGATIVE
Glucose, UA: NEGATIVE
Ketones, POC UA: NEGATIVE
NITRITE UA: NEGATIVE
PH UA: 6
Protein Ur, POC: NEGATIVE
Spec Grav, UA: 1.01
UROBILINOGEN UA: 0.2

## 2016-04-21 LAB — POC MICROSCOPIC URINALYSIS (UMFC): Mucus: ABSENT

## 2016-04-21 NOTE — Patient Instructions (Signed)
     IF you received an x-ray today, you will receive an invoice from [facility]. Please contact [facility] at [phone] with questions or concerns regarding your invoice.   IF you received labwork today, you will receive an invoice from [facility]. Please contact [facility] at [phone] with questions or concerns regarding your invoice.   Our billing staff will not be able to assist you with questions regarding bills from these companies.  You will be contacted with the lab results as soon as they are available. The fastest way to get your results is to activate your My Chart account. Instructions are located on the last page of this paperwork. If you have not heard from us regarding the results in 2 weeks, please contact this office.      

## 2016-04-21 NOTE — Progress Notes (Signed)
Patient ID: [name], female    DOB: 1971-03-28, 45 y.o.   MRN: [id number]  PCP: Ritamarie Arkin, PA-C  Subjective:   Chief Complaint  Patient presents with  . Dysuria    started Friday  . Urinary Frequency  . Back Pain    HPI Presents for evaluation of possible UTI.  Symptoms began 3 days ago with burning with urination, urgency, frequency, low grade fever and back ache. OTC AZO STAT and pushed fluids. Awoke this am feeling good. All symptoms are resolved.  Has reconciled with her husband. They are living together again and she is happier than ever.    Review of Systems  Constitutional: Negative.   Gastrointestinal: Negative.   Genitourinary: Negative.   Musculoskeletal: Negative.        Patient Active Problem List   Diagnosis Date Noted  . S/P laparoscopic appendectomy 05/14/2015  . BMI 32.0-32.9,adult 03/26/2015  . History of abnormal cervical Pap smear 03/26/2015     Prior to Admission medications   Medication Sig Start Date End Date Taking? Authorizing Provider  escitalopram (LEXAPRO) 10 MG tablet Take 1 tablet (10 mg total) by mouth daily. 06/22/15  Yes Nel Stoneking, PA-C  ibuprofen (ADVIL,MOTRIN) 100 MG chewable tablet Chew by mouth every 8 (eight) hours as needed.   Yes Historical Provider, MD  Multiple Vitamins-Minerals (MULTIVITAMIN WITH MINERALS) tablet Take 1 tablet by mouth daily.   Yes Historical Provider, MD  naproxen sodium (ANAPROX) 220 MG tablet Take 220 mg by mouth 2 (two) times daily with a meal.   Yes Historical Provider, MD     Allergies  Allergen Reactions  . Latex        Objective:  Physical Exam  Constitutional: She is oriented to person, place, and time. She appears well-developed and well-nourished. She is active and cooperative. No distress.  BP 136/74 mmHg  Pulse 72  Resp 16  Ht 5' 7.5" (1.715 m)  Wt 174 lb (78.926 kg)  BMI 26.83 kg/m2  LMP 04/11/2016  HENT:  Head: Normocephalic and atraumatic.  Right Ear:  Hearing normal.  Left Ear: Hearing normal.  Eyes: Conjunctivae are normal. No scleral icterus.  Neck: Normal range of motion. Neck supple. No thyromegaly present.  Cardiovascular: Normal rate, regular rhythm and normal heart sounds.   Pulses:      Radial pulses are 2+ on the right side, and 2+ on the left side.  Pulmonary/Chest: Effort normal and breath sounds normal.  Lymphadenopathy:       Head (right side): No tonsillar, no preauricular, no posterior auricular and no occipital adenopathy present.       Head (left side): No tonsillar, no preauricular, no posterior auricular and no occipital adenopathy present.    She has no cervical adenopathy.       Right: No supraclavicular adenopathy present.       Left: No supraclavicular adenopathy present.  Neurological: She is alert and oriented to person, place, and time. No sensory deficit.  Skin: Skin is warm, dry and intact. No rash noted. No cyanosis or erythema. Nails show no clubbing.  Psychiatric: She has a normal mood and affect. Her speech is normal and behavior is normal.       Results for orders placed or performed in visit on 04/21/16  POCT urinalysis dipstick  Result Value Ref Range   Color, UA yellow yellow   Clarity, UA hazy (A) clear   Glucose, UA negative negative   Bilirubin, UA negative negative   Ketones, POC  UA negative negative   Spec Grav, UA 1.010    Blood, UA trace-lysed (A) negative   pH, UA 6.0    Protein Ur, POC negative negative   Urobilinogen, UA 0.2    Nitrite, UA Negative Negative   Leukocytes, UA small (1+) (A) Negative  POCT Microscopic Urinalysis ([facility])  Result Value Ref Range   WBC,UR,HPF,POC Moderate (A) None WBC/hpf   RBC,UR,HPF,POC None None RBC/hpf   Bacteria Few (A) None, Too numerous to count   Mucus Absent Absent   Epithelial Cells, UR Per Microscopy Few (A) None, Too numerous to count cells/hpf       Assessment & Plan:   1. Dysuria 2. Frequency Await UCx. Likely has self-treated.  Anticipatory guidance provided. - POCT urinalysis dipstick - POCT Microscopic Urinalysis ([facility]) - Urine culture    Fernande Bras, PA-C Physician Assistant-Certified [facility]

## 2016-04-23 LAB — URINE CULTURE: Colony Count: >=100000

## 2016-04-23 MED ORDER — SULFAMETHOXAZOLE-TRIMETHOPRIM 800-160 MG PO TABS: 1.0000 | ORAL_TABLET | Freq: Two times a day (BID) | ORAL | Status: DC

## 2016-04-23 NOTE — Addendum Note (Signed)
Addended by: Fernande BrasJEFFERY, Rupert Azzara S on: 04/23/2016 08:36 AM   Modules accepted: Orders, SmartSet

## 2016-08-11 ENCOUNTER — Other Ambulatory Visit: Payer: Self-pay | Admitting: Physician Assistant

## 2016-08-11 DIAGNOSIS — F411 Generalized anxiety disorder: Secondary | ICD-10-CM

## 2016-08-17 ENCOUNTER — Other Ambulatory Visit: Payer: Self-pay | Admitting: Physician Assistant

## 2016-08-17 DIAGNOSIS — F411 Generalized anxiety disorder: Secondary | ICD-10-CM

## 2016-08-18 ENCOUNTER — Encounter: Payer: Self-pay | Admitting: Physician Assistant

## 2016-08-18 ENCOUNTER — Ambulatory Visit (INDEPENDENT_AMBULATORY_CARE_PROVIDER_SITE_OTHER): Payer: BLUE CROSS/BLUE SHIELD | Admitting: Physician Assistant

## 2016-08-18 VITALS — BP 128/80 | HR 60 | Temp 98.0°F | Resp 18 | Ht 67.5 in | Wt 177.0 lb

## 2016-08-18 DIAGNOSIS — Z658 Other specified problems related to psychosocial circumstances: Secondary | ICD-10-CM | POA: Diagnosis not present

## 2016-08-18 DIAGNOSIS — F439 Reaction to severe stress, unspecified: Secondary | ICD-10-CM

## 2016-08-18 MED ORDER — ESCITALOPRAM OXALATE 10 MG PO TABS: 10.0000 mg | ORAL_TABLET | Freq: Every day | ORAL | 3 refills | Status: DC

## 2016-08-18 NOTE — Progress Notes (Signed)
Patient ID: [name], female    DOB: Jun 09, 1971, 45 y.o.   MRN: [id number]  PCP: Porfirio Oarhelle Amarionna Arca, PA-C  Subjective:   Chief Complaint  Patient presents with  . Medication Refill    lexapro    HPI Presents for evaluation of mood and needs refill of escitalopram.  Out of escitalopram x 3 weeks. Her husband really thinks that there is a difference when she takes it and gets anxious when she runs out. She reports that he is unaware that she hasn't been taking it and hasn't mentioned anything different about her mood. Leaves for vacation tomorrow. Tolerating it well without problems.   Review of Systems  Constitutional: Negative.   HENT: Negative for sore throat.   Eyes: Negative for visual disturbance.  Respiratory: Negative for cough, chest tightness, shortness of breath and wheezing.   Cardiovascular: Negative for chest pain and palpitations.  Gastrointestinal: Negative for abdominal pain, diarrhea, nausea and vomiting.  Genitourinary: Negative for dysuria, frequency, hematuria and urgency.  Musculoskeletal: Negative for arthralgias and myalgias.  Skin: Negative for rash.  Neurological: Negative for dizziness, weakness and headaches.  Psychiatric/Behavioral: Negative for decreased concentration. The patient is not nervous/anxious.        Patient Active Problem List   Diagnosis Date Noted  . S/P laparoscopic appendectomy 05/14/2015  . BMI 32.0-32.9,adult 03/26/2015  . History of abnormal cervical Pap smear 03/26/2015     Prior to Admission medications   Medication Sig Start Date End Date Taking? Authorizing Provider  escitalopram (LEXAPRO) 10 MG tablet Take 1 tablet (10 mg total) by mouth daily. 06/22/15  Yes Kassaundra Hair, PA-C  ibuprofen (ADVIL,MOTRIN) 100 MG chewable tablet Chew by mouth every 8 (eight) hours as needed.   Yes Historical Provider, MD  Multiple Vitamins-Minerals (MULTIVITAMIN WITH MINERALS) tablet Take 1 tablet by mouth daily.   Yes  Historical Provider, MD  naproxen sodium (ANAPROX) 220 MG tablet Take 220 mg by mouth 2 (two) times daily with a meal.   Yes Historical Provider, MD     Allergies  Allergen Reactions  . Latex        Objective:  Physical Exam  Constitutional: She is oriented to person, place, and time. She appears well-developed and well-nourished. She is active and cooperative. No distress.  BP 128/80 (BP Location: Right Arm, Patient Position: Sitting, Cuff Size: Normal)   Pulse 60   Temp 98 F (36.7 C) (Oral)   Resp 18   Ht 5' 7.5" (1.715 m)   Wt 177 lb (80.3 kg)   LMP 08/17/2016 (Approximate)   SpO2 98%   BMI 27.31 kg/m   HENT:  Head: Normocephalic and atraumatic.  Right Ear: Hearing normal.  Left Ear: Hearing normal.  Eyes: Conjunctivae are normal. No scleral icterus.  Neck: Normal range of motion. Neck supple. No thyromegaly present.  Cardiovascular: Normal rate, regular rhythm and normal heart sounds.   Pulses:      Radial pulses are 2+ on the right side, and 2+ on the left side.  Pulmonary/Chest: Effort normal and breath sounds normal.  Lymphadenopathy:       Head (right side): No tonsillar, no preauricular, no posterior auricular and no occipital adenopathy present.       Head (left side): No tonsillar, no preauricular, no posterior auricular and no occipital adenopathy present.    She has no cervical adenopathy.       Right: No supraclavicular adenopathy present.       Left: No supraclavicular adenopathy present.  Neurological: She is alert and oriented to person, place, and time. No sensory deficit.  Skin: Skin is warm, dry and intact. No rash noted. No cyanosis or erythema. Nails show no clubbing.  Psychiatric: She has a normal mood and affect. Her speech is normal and behavior is normal.           Assessment & Plan:   1. Situational stress Controlled. Stable. Continue current treatment. - escitalopram (LEXAPRO) 10 MG tablet; Take 1 tablet (10 mg total) by mouth daily.   Dispense: 90 tablet; Refill: 3   Fernande Brashelle S. Terez Freimark, PA-C Physician Assistant-Certified [facility]

## 2016-08-18 NOTE — Patient Instructions (Signed)
     IF you received an x-ray today, you will receive an invoice from [facility]. Please contact [facility] at [phone] with questions or concerns regarding your invoice.   IF you received labwork today, you will receive an invoice from [facility]. Please contact [facility] at [phone] with questions or concerns regarding your invoice.   Our billing staff will not be able to assist you with questions regarding bills from these companies.  You will be contacted with the lab results as soon as they are available. The fastest way to get your results is to activate your My Chart account. Instructions are located on the last page of this paperwork. If you have not heard from us regarding the results in 2 weeks, please contact this office.      

## 2016-09-01 ENCOUNTER — Ambulatory Visit (INDEPENDENT_AMBULATORY_CARE_PROVIDER_SITE_OTHER): Payer: BLUE CROSS/BLUE SHIELD | Admitting: Physician Assistant

## 2016-09-01 ENCOUNTER — Encounter: Payer: Self-pay | Admitting: Physician Assistant

## 2016-09-01 VITALS — BP 102/64 | HR 72 | Temp 98.0°F | Resp 18 | Ht 67.5 in | Wt 174.6 lb

## 2016-09-01 DIAGNOSIS — Z0000 Encounter for general adult medical examination without abnormal findings: Secondary | ICD-10-CM | POA: Diagnosis not present

## 2016-09-01 DIAGNOSIS — Z13220 Encounter for screening for lipoid disorders: Secondary | ICD-10-CM

## 2016-09-01 DIAGNOSIS — Z23 Encounter for immunization: Secondary | ICD-10-CM

## 2016-09-01 DIAGNOSIS — Z13228 Encounter for screening for other metabolic disorders: Secondary | ICD-10-CM | POA: Diagnosis not present

## 2016-09-01 DIAGNOSIS — Z130 Encounter for screening for diseases of the blood and blood-forming organs and certain disorders involving the immune mechanism: Secondary | ICD-10-CM | POA: Diagnosis not present

## 2016-09-01 LAB — CBC WITH DIFFERENTIAL/PLATELET
BASOS ABS: 59 {cells}/uL (ref 0–200)
BASOS PCT: 1 %
EOS ABS: 177 {cells}/uL (ref 15–500)
Eosinophils Relative: 3 %
HEMATOCRIT: 37.6 % (ref 35.0–45.0)
Hemoglobin: 12.7 g/dL (ref 11.7–15.5)
LYMPHS PCT: 27 %
Lymphs Abs: 1593 cells/uL (ref 850–3900)
MCH: 29.6 pg (ref 27.0–33.0)
MCHC: 33.8 g/dL (ref 32.0–36.0)
MCV: 87.6 fL (ref 80.0–100.0)
MONO ABS: 413 {cells}/uL (ref 200–950)
MONOS PCT: 7 %
MPV: 11.5 fL (ref 7.5–12.5)
Neutro Abs: 3658 cells/uL (ref 1500–7800)
Neutrophils Relative %: 62 %
PLATELETS: 312 10*3/uL (ref 140–400)
RBC: 4.29 MIL/uL (ref 3.80–5.10)
RDW: 13.4 % (ref 11.0–15.0)
WBC: 5.9 10*3/uL (ref 3.8–10.8)

## 2016-09-01 LAB — LIPID PANEL
Cholesterol: 156 mg/dL (ref 125–200)
HDL: 52 mg/dL (ref >=46)
LDL CALC: 94 mg/dL (ref <130)
TRIGLYCERIDES: 49 mg/dL (ref <150)
Total CHOL/HDL Ratio: 3 Ratio (ref <=5.0)
VLDL: 10 mg/dL (ref <30)

## 2016-09-01 LAB — COMPREHENSIVE METABOLIC PANEL
ALK PHOS: 57 U/L (ref 33–115)
ALT: 12 U/L (ref 6–29)
AST: 20 U/L (ref 10–35)
Albumin: 3.9 g/dL (ref 3.6–5.1)
BUN: 11 mg/dL (ref 7–25)
CALCIUM: 8.9 mg/dL (ref 8.6–10.2)
CHLORIDE: 105 mmol/L (ref 98–110)
CO2: 25 mmol/L (ref 20–31)
Creat: 0.83 mg/dL (ref 0.50–1.10)
GLUCOSE: 100 mg/dL — AB (ref 65–99)
POTASSIUM: 4.9 mmol/L (ref 3.5–5.3)
Sodium: 138 mmol/L (ref 135–146)
Total Bilirubin: 0.7 mg/dL (ref 0.2–1.2)
Total Protein: 6.8 g/dL (ref 6.1–8.1)

## 2016-09-01 NOTE — Patient Instructions (Addendum)
   IF you received an x-ray today, you will receive an invoice from [facility]. Please contact [facility] at [phone] with questions or concerns regarding your invoice.   IF you received labwork today, you will receive an invoice from [facility]. Please contact [facility] at [phone] with questions or concerns regarding your invoice.   Our billing staff will not be able to assist you with questions regarding bills from these companies.  You will be contacted with the lab results as soon as they are available. The fastest way to get your results is to activate your My Chart account. Instructions are located on the last page of this paperwork. If you have not heard from us regarding the results in 2 weeks, please contact this office.     Keeping You Healthy  Get These Tests 1. Blood Pressure- Have your blood pressure checked once a year by your health care provider.  Normal blood pressure is 120/80. 2. Weight- Have your body mass index (BMI) calculated to screen for obesity.  BMI is measure of body fat based on height and weight.  You can also calculate your own BMI at www.nhlbisupport.com/bmi/. 3. Cholesterol- Have your cholesterol checked every 5 years starting at age 20 then yearly starting at age 45. 4. Chlamydia, HIV, and other sexually transmitted diseases- Get screened every year until age 25, then within three months of each new sexual provider. 5. Pap Test - Every 1-5 years; discuss with your health care provider. 6. Mammogram- Every 1-2 years starting at age 40--50  Take these medicines  Calcium with Vitamin D-Your body needs 1200 mg of Calcium each day and 800-1000 IU of Vitamin D daily.  Your body can only absorb 500 mg of Calcium at a time so Calcium must be taken in 2 or 3 divided doses throughout the day.  Multivitamin with folic acid- Once daily if it is possible for you to become pregnant.  Get these  Immunizations  Gardasil-Series of three doses; prevents HPV related illness such as genital warts and cervical cancer.  Menactra-Single dose; prevents meningitis.  Tetanus shot- Every 10 years.  Flu shot-Every year.  Take these steps 1. Do not smoke-Your healthcare provider can help you quit.  For tips on how to quit go to www.smokefree.gov or call 1-800 QUITNOW. 2. Be physically active- Exercise 5 days a week for at least 30 minutes.  If you are not already physically active, start slow and gradually work up to 30 minutes of moderate physical activity.  Examples of moderate activity include walking briskly, dancing, swimming, bicycling, etc. 3. Breast Cancer- A self breast exam every month is important for early detection of breast cancer.  For more information and instruction on self breast exams, ask your healthcare provider or www.womenshealth.gov/faq/breast-self-exam.cfm. 4. Eat a healthy diet- Eat a variety of healthy foods such as fruits, vegetables, whole grains, low fat milk, low fat cheeses, yogurt, lean meats, poultry and fish, beans, nuts, tofu, etc.  For more information go to www. Thenutritionsource.org 5. Drink alcohol in moderation- Limit alcohol intake to one drink or less per day. Never drink and drive. 6. Depression- Your emotional health is as important as your physical health.  If you're feeling down or losing interest in things you normally enjoy please talk to your healthcare provider about being screened for depression. 7. Dental visit- Brush and floss your teeth twice daily; visit your dentist twice a year. 8. Eye doctor- Get an eye exam at least every   2 years. 9. Helmet use- Always wear a helmet when riding a bicycle, motorcycle, rollerblading or skateboarding. 10. Safe sex- If you may be exposed to sexually transmitted infections, use a condom. 11. Seat belts- Seat belts can save your live; always wear one. 12. Smoke/Carbon Monoxide detectors- These detectors need to  be installed on the appropriate level of your home. Replace batteries at least once a year. 13. Skin cancer- When out in the sun please cover up and use sunscreen 15 SPF or higher. 14. Violence- If anyone is threatening or hurting you, please tell your healthcare provider.        

## 2016-09-01 NOTE — Progress Notes (Signed)
Patient ID: Kelli Potter, female    DOB: 23-May-1971, 45 y.o.   MRN: [id number]  PCP: Porfirio Oar, PA-C  Chief Complaint  Patient presents with  . Annual Exam    CPE     Subjective:   HPI: Presents for [employer] Visit.  Cervical Cancer Screening: negative cytology, negative HPV 05/2015. Repeat 5 years. Breast Cancer Screening: not yet. Plans to wait until age 44. Colorectal Cancer Screening: Not yet a candidate Bone Density Testing: not yet a candidate HIV Screening: completed STI Screening: very low risk Seasonal Influenza Vaccination: today Td/Tdap Vaccination: 02/2011 Pneumococcal Vaccination: not yet a candidate Zoster Vaccination: not yet a candidate Frequency of Dental evaluation: Q6 months Frequency of Eye evaluation: annually    Patient Active Problem List   Diagnosis Date Noted  . S/P laparoscopic appendectomy 05/14/2015  . BMI 32.0-32.9,adult 03/26/2015  . History of abnormal cervical Pap smear 03/26/2015    Past Medical History:  Diagnosis Date  . Allergy   . Asthma   . History of kidney infection "as a child"     Prior to Admission medications   Medication Sig Start Date End Date Taking? Authorizing Provider  escitalopram (LEXAPRO) 10 MG tablet Take 1 tablet (10 mg total) by mouth daily. 08/18/16  Yes Keegan Bensch, PA-C  ibuprofen (ADVIL,MOTRIN) 100 MG chewable tablet Chew by mouth every 8 (eight) hours as needed.   Yes Historical Provider, MD  Multiple Vitamins-Minerals (MULTIVITAMIN WITH MINERALS) tablet Take 1 tablet by mouth daily.   Yes Historical Provider, MD  naproxen sodium (ANAPROX) 220 MG tablet Take 220 mg by mouth 2 (two) times daily with a meal.   Yes Historical Provider, MD    Allergies  Allergen Reactions  . Latex     Past Surgical History:  Procedure Laterality Date  . APPENDECTOMY  05/14/2015  . kidney evaluation under anesthesia  "as a child"  . LAPAROSCOPIC APPENDECTOMY N/A 05/14/2015   Procedure: APPENDECTOMY  LAPAROSCOPIC;  Surgeon: Emelia Loron, MD;  Location: [facility] OR;  Service: General;  Laterality: N/A;    Family History  Problem Relation Age of Onset  . Pulmonary fibrosis Mother   . Hypertension Father   . Eating disorder Sister   . Thyroid disease Brother     Graves Disease  . Hyperlipidemia Brother   . Stroke Maternal Grandmother   . Cancer Paternal Grandfather     Social History   Social History  . Marital status: Married    Spouse name: Sailor Hevia  . Number of children: 2  . Years of education: 15   Occupational History  . 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 Smoking status: Never Smoker  . Smokeless tobacco: Never Used  . Alcohol use No  . Drug use: No  . Sexual activity: Yes    Partners: Male    Birth control/ protection: Other-see comments     Comment: partner (Husband) s/p vasectomy   Other Topics Concern  . None   Social History Narrative   Lives with her husband. 2 sons in college.   She left college after 3 years without a degree.   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       Review of Systems  Constitutional: Negative for chills and fever.  HENT: Negative for congestion and sore throat.   Eyes: Negative for pain.  Respiratory: Negative for cough, shortness of breath and wheezing.   Cardiovascular: Negative for chest  pain, palpitations and leg swelling.  Gastrointestinal: Negative for abdominal pain, blood in stool, constipation, diarrhea, nausea and vomiting.  Endocrine: Negative for polydipsia.  Genitourinary: Negative for dysuria, frequency, hematuria and urgency.  Musculoskeletal: Negative for back pain, myalgias and neck pain.  Skin: Negative.  Negative for rash.  Allergic/Immunologic: Negative for environmental allergies.  Neurological: Negative for dizziness, weakness and headaches.  Hematological: Does not bruise/bleed easily.  Psychiatric/Behavioral: Negative for suicidal ideas. The  patient is not nervous/anxious.         Objective:  Physical Exam  Constitutional: She is oriented to person, place, and time. Vital signs are normal. She appears well-developed and well-nourished. She is active and cooperative. No distress.  BP 102/64   Pulse 72   Temp 98 F (36.7 C) (Oral)   Resp 18   Ht 5' 7.5" (1.715 m)   Wt 174 lb 9.6 oz (79.2 kg)   LMP 08/17/2016 (Approximate)   SpO2 100%   BMI 26.94 kg/m    HENT:  Head: Normocephalic and atraumatic.  Right Ear: Hearing, tympanic membrane, external ear and ear canal normal. No foreign bodies.  Left Ear: Hearing, tympanic membrane, external ear and ear canal normal. No foreign bodies.  Nose: Nose normal.  Mouth/Throat: Uvula is midline, oropharynx is clear and moist and mucous membranes are normal. No oral lesions. Normal dentition. No dental abscesses or uvula swelling. No oropharyngeal exudate.  Eyes: Conjunctivae, EOM and lids are normal. Pupils are equal, round, and reactive to light. Right eye exhibits no discharge. Left eye exhibits no discharge. No scleral icterus.  Fundoscopic exam:      The right eye shows no arteriolar narrowing, no AV nicking, no exudate, no hemorrhage and no papilledema. The right eye shows red reflex.       The left eye shows no arteriolar narrowing, no AV nicking, no exudate, no hemorrhage and no papilledema. The left eye shows red reflex.  Neck: Trachea normal, normal range of motion and full passive range of motion without pain. Neck supple. No spinous process tenderness and no muscular tenderness present. No thyroid mass and no thyromegaly present.  Cardiovascular: Normal rate, regular rhythm, normal heart sounds, intact distal pulses and normal pulses.   Pulmonary/Chest: Effort normal and breath sounds normal. Right breast exhibits no inverted nipple, no mass, no nipple discharge, no skin change and no tenderness. Left breast exhibits no inverted nipple, no mass, no nipple discharge, no skin  change and no tenderness. Breasts are symmetrical.  Abdominal: Soft. Bowel sounds are normal. She exhibits no mass. There is no tenderness.  Musculoskeletal: She exhibits no edema or tenderness.       Cervical back: Normal.       Thoracic back: Normal.       Lumbar back: Normal.  Lymphadenopathy:       Head (right side): No tonsillar, no preauricular, no posterior auricular and no occipital adenopathy present.       Head (left side): No tonsillar, no preauricular, no posterior auricular and no occipital adenopathy present.    She has no cervical adenopathy.       Right: No supraclavicular adenopathy present.       Left: No supraclavicular adenopathy present.  Neurological: She is alert and oriented to person, place, and time. She has normal strength and normal reflexes. No cranial nerve deficit. She exhibits normal muscle tone. Coordination and gait normal.  Skin: Skin is warm, dry and intact. No rash noted. She is not diaphoretic. No cyanosis or  erythema. Nails show no clubbing.  Psychiatric: She has a normal mood and affect. Her speech is normal and behavior is normal. Judgment and thought content normal.           Assessment & Plan:  1. Annual physical exam Age appropriate anticipatory guidance provided.  2. Flu vaccine need - Flu Vaccine QUAD 36+ mos IM  3. Screening for hyperlipidemia - Lipid panel  4. Screening for deficiency anemia - CBC with Differential/Platelet  5. Screening for metabolic disorder - Comprehensive metabolic panel   Fernande Bras, PA-C Physician Assistant-Certified [facility]

## 2016-12-30 ENCOUNTER — Encounter: Payer: Self-pay | Admitting: Physician Assistant

## 2016-12-31 ENCOUNTER — Encounter: Payer: Self-pay | Admitting: Physician Assistant

## 2017-01-01 ENCOUNTER — Encounter: Payer: Self-pay | Admitting: Physician Assistant

## 2017-03-07 IMAGING — CT CT ABD-PELV W/ CM
2 of 5 series · 10 of 46 positions shown, 11 images · IV contrast (Iodine)
Comparison: 05/09/2004

CLINICAL DATA: RLQ PAIN X 3 DAYS, NEG FEVER, NAUSEA,

EXAM:
CT ABDOMEN AND PELVIS WITH CONTRAST
TECHNIQUE: Multidetector CT imaging of the abdomen and pelvis was performed
using the standard protocol following bolus administration of
intravenous contrast.
CONTRAST:  100mL OMNIPAQUE IOHEXOL 300 MG/ML  SOLN

[Series 201: routine, idose (2) · axial · 0.80mm/px · z∈[-490,-75]mm · 7 of 103 slices shown, 8 images]
[im 10/103  soft-tissue]
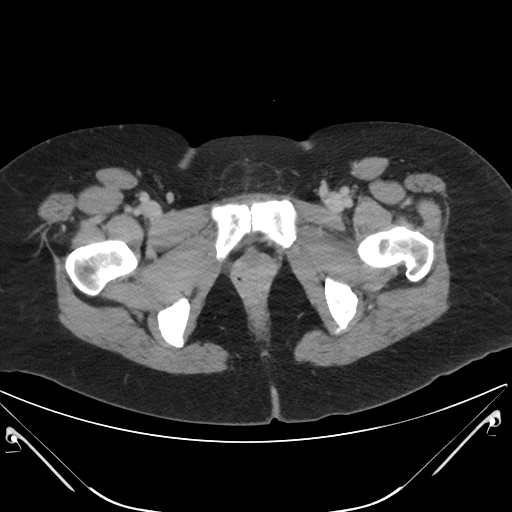
[im 10/103  bone]
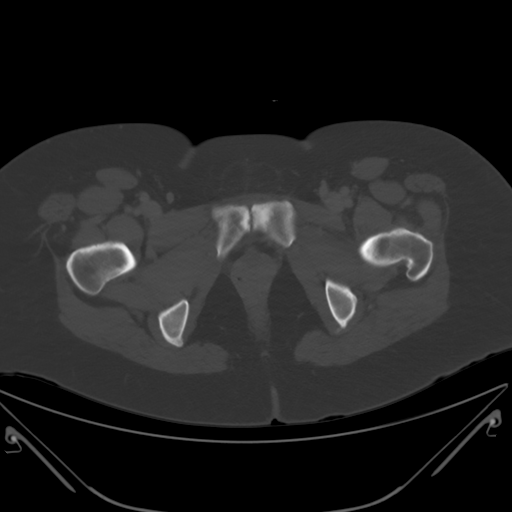
[im 25/103  soft-tissue]
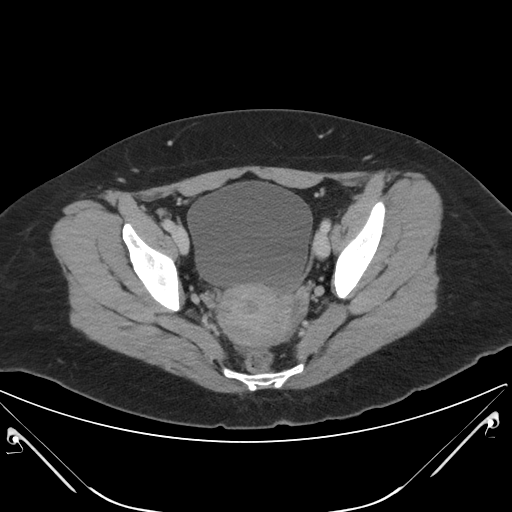
[im 39/103  soft-tissue]
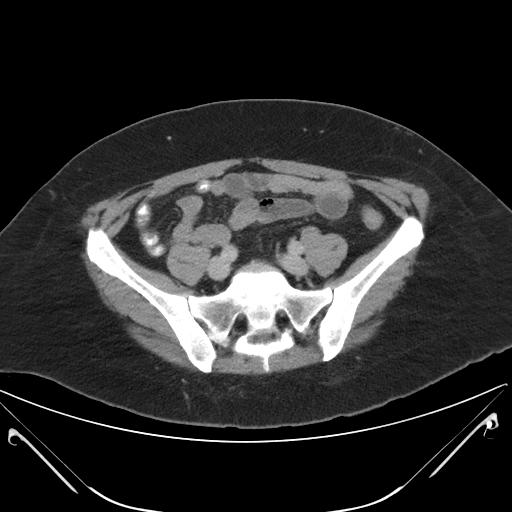
[im 54/103  soft-tissue]
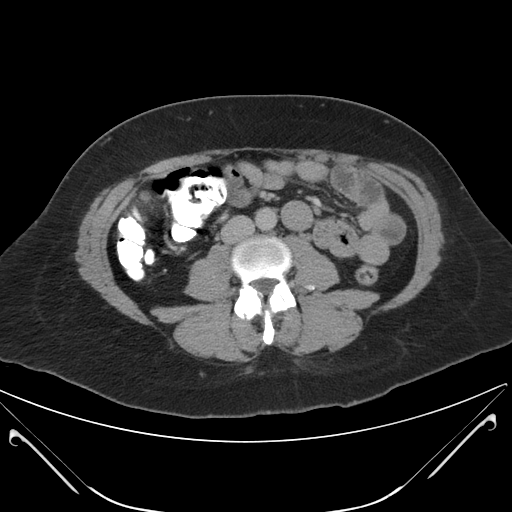
[im 64/103  soft-tissue]
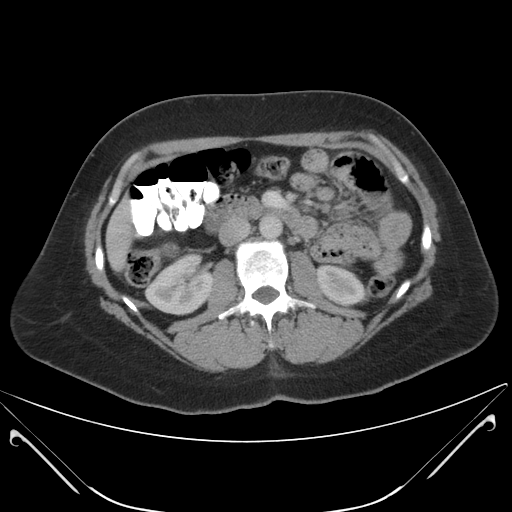
[im 78/103  soft-tissue]
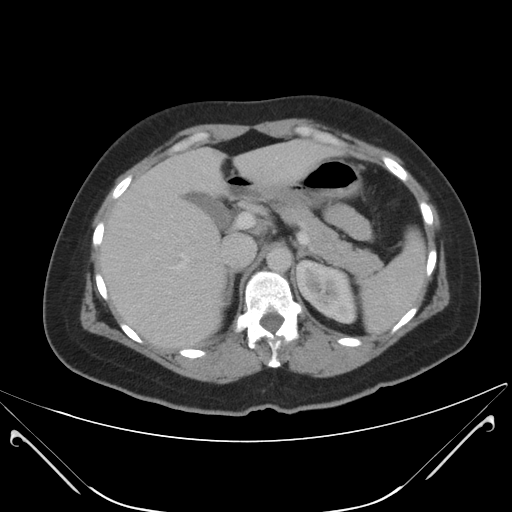
[im 93/103  soft-tissue]
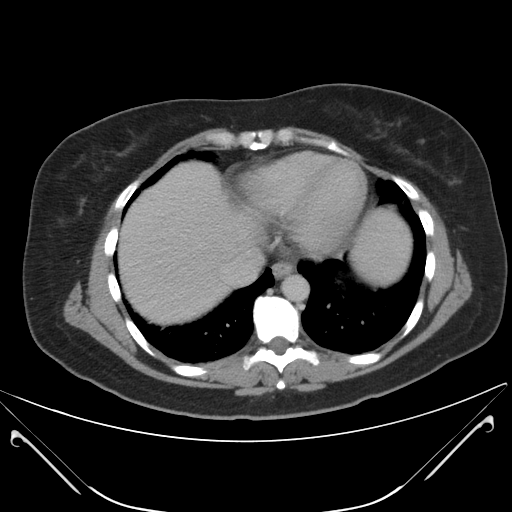

[Series 202: coronals, idose (2) · coronal · 0.45mm/px · 3 of 102 slices shown]
[im 34/102  soft-tissue]
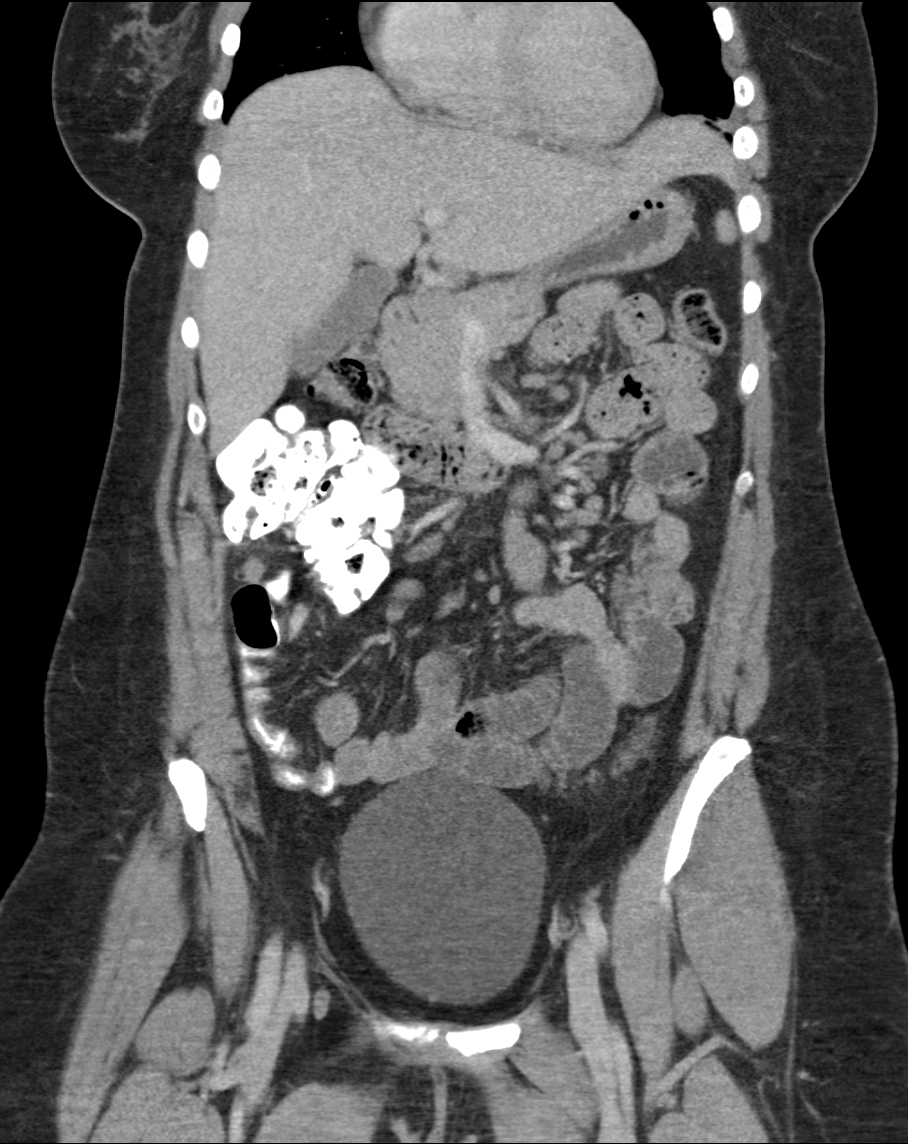
[im 45/102  soft-tissue]
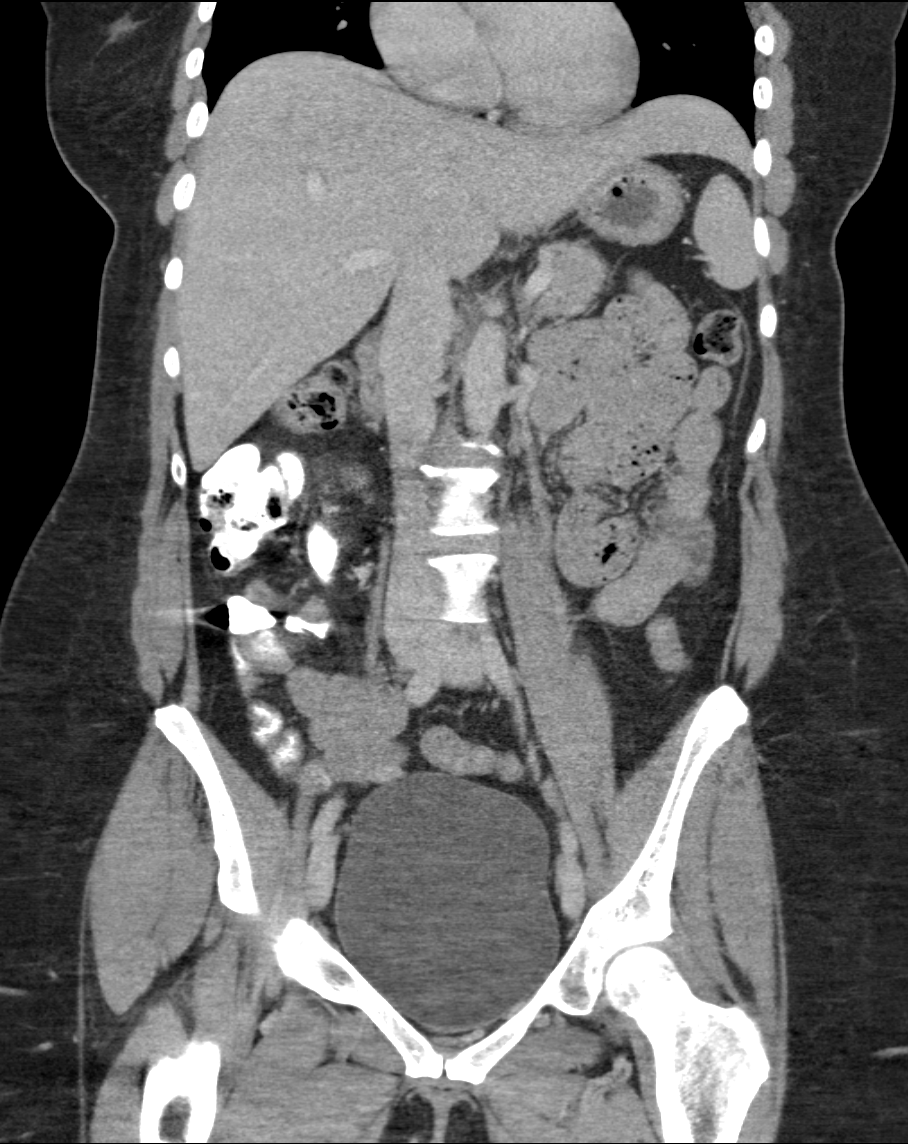
[im 57/102  soft-tissue]
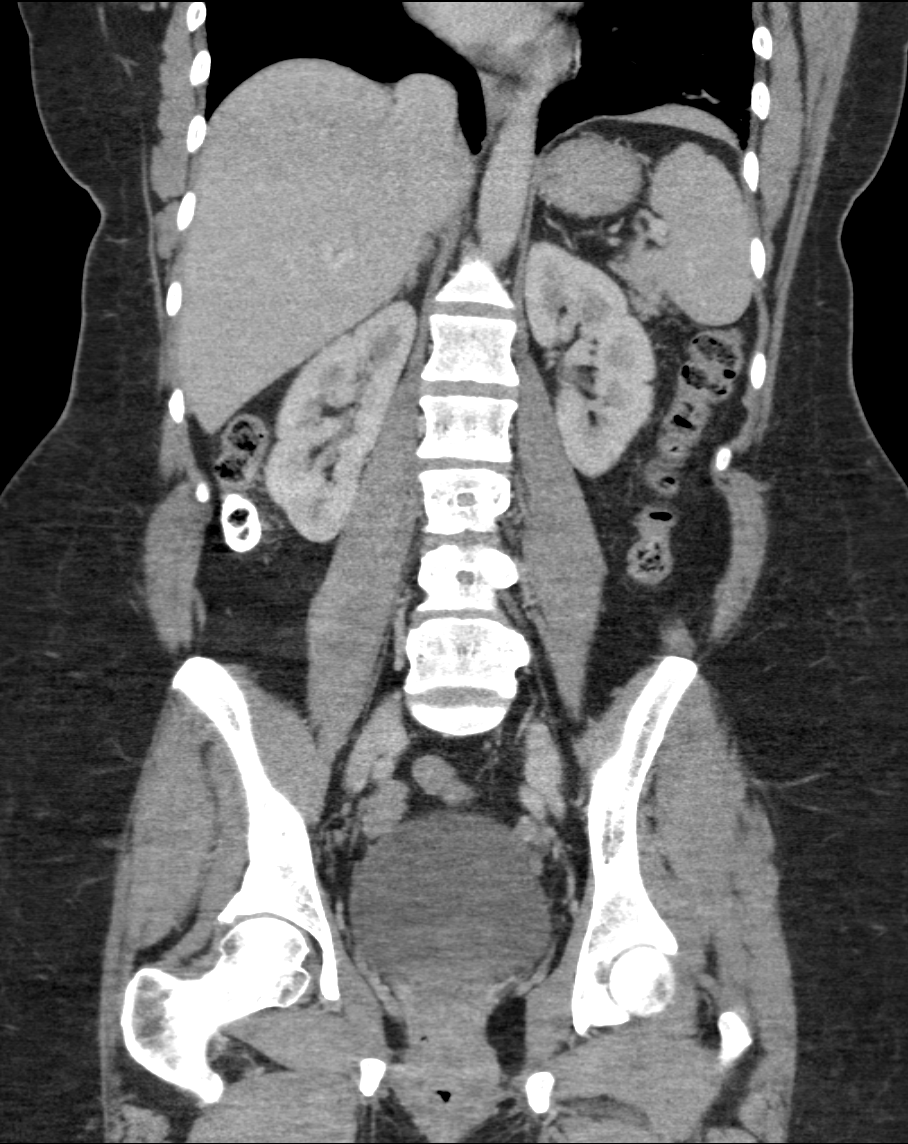

[10 of 46 positions shown; findings below may reference images not displayed]

FINDINGS: Minimal dependent atelectasis posteriorly in the visualized lung
bases. Unremarkable liver, nondilated gallbladder, spleen, adrenal
glands, pancreas, kidneys. Aorta and portal vein unremarkable.
Stomach, small bowel, and colon are nondilated. The appendix is
thick walled, dilated to a diameter of 11 mm, with some adjacent
inflammatory/edematous change. No extraluminal gas or fluid
collection is identified. Urinary bladder physiologically distended.
Uterus and adnexal regions unremarkable. Small amount of fluid in
pelvic cul-de-sac. No free air. No hydronephrosis. Minimal
degenerative changes in the lumbar spine.
IMPRESSION: 1. Acute appendicitis without evidence of perforation or abscess.
Critical Value/emergent results were called by telephone at the time
of interpretation on 05/14/2015 at [DATE] to Dr. Martinkauppi, who verbally
acknowledged these results.

## 2017-07-06 ENCOUNTER — Ambulatory Visit (INDEPENDENT_AMBULATORY_CARE_PROVIDER_SITE_OTHER): Payer: BLUE CROSS/BLUE SHIELD | Admitting: Physician Assistant

## 2017-07-06 ENCOUNTER — Encounter: Payer: Self-pay | Admitting: Physician Assistant

## 2017-07-06 VITALS — BP 107/72 | HR 74 | Temp 98.1°F | Resp 18 | Ht 67.5 in | Wt 199.4 lb

## 2017-07-06 DIAGNOSIS — F439 Reaction to severe stress, unspecified: Secondary | ICD-10-CM | POA: Diagnosis not present

## 2017-07-06 DIAGNOSIS — Z0000 Encounter for general adult medical examination without abnormal findings: Secondary | ICD-10-CM | POA: Diagnosis not present

## 2017-07-06 DIAGNOSIS — Z6830 Body mass index (BMI) 30.0-30.9, adult: Secondary | ICD-10-CM

## 2017-07-06 DIAGNOSIS — N96 Recurrent pregnancy loss: Secondary | ICD-10-CM | POA: Insufficient documentation

## 2017-07-06 MED ORDER — ESCITALOPRAM OXALATE 10 MG PO TABS: 10.0000 mg | ORAL_TABLET | Freq: Every day | ORAL | 3 refills | Status: AC

## 2017-07-06 NOTE — Assessment & Plan Note (Signed)
Stable.  Continue current treatment

## 2017-07-06 NOTE — Progress Notes (Signed)
Patient ID: Kelli Potter, female    DOB: 1971-10-25, 46 y.o.   MRN: [id number]  PCP: Porfirio Oar, PA-C  Chief Complaint  Patient presents with  . Annual Exam    Needs PAP    Subjective:   Presents for [employer] Visit.  Cervical Cancer Screening: abnormal paps >20 years ago. Normal cytology and negative HPV 2016. Repeat 5 years. Breast Cancer Screening: Annual CBE, monthly SBE. Plans to wait until age 30 for screening mammography. Colorectal Cancer Screening: not yet a candidate Bone Density Testing: not yet a candidate HIV Screening: completed, NEGATIVE 04/2015 STI Screening: very low risk Seasonal Influenza Vaccination: annually Td/Tdap Vaccination: current Pneumococcal Vaccination: not yet a candidate Zoster Vaccination: Zostavax 2012, will need Shingrix at age 75. 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  She and her husband will be taking a cruise to [place] in August.    Patient Active Problem List   Diagnosis Date Noted  . S/P laparoscopic appendectomy 05/14/2015  . BMI 32.0-32.9,adult 03/26/2015  . History of abnormal cervical Pap smear 03/26/2015    Past Medical History:  Diagnosis Date  . Allergy   . Asthma   . History of kidney infection "as a child"     Prior to Admission medications   Medication Sig Start Date End Date Taking? Authorizing Provider  escitalopram (LEXAPRO) 10 MG tablet Take 1 tablet (10 mg total) by mouth daily. 08/18/16  Yes Shadeed Colberg, PA-C  ibuprofen (ADVIL,MOTRIN) 100 MG chewable tablet Chew by mouth every 8 (eight) hours as needed.   Yes [provider]  Multiple Vitamins-Minerals (MULTIVITAMIN WITH MINERALS) tablet Take 1 tablet by mouth daily.   Yes  [provider]  naproxen sodium (ANAPROX) 220 MG tablet Take 220 mg by mouth 2 (two) times daily with a meal.   Yes [provider]    Allergies  Allergen Reactions  . Latex     Past Surgical History:  Procedure Laterality Date  . APPENDECTOMY  05/14/2015  . kidney evaluation under anesthesia  "as a child"  . LAPAROSCOPIC APPENDECTOMY N/A 05/14/2015   Procedure: APPENDECTOMY LAPAROSCOPIC;  Surgeon: Emelia Loron, MD;  Location: [facility] OR;  Service: General;  Laterality: N/A;    Family History  Problem Relation Age of Onset  . Pulmonary fibrosis Mother   . Hypertension Father   . Eating disorder Sister   . Thyroid disease Brother        Graves Disease  . Hyperlipidemia Brother   . Stroke Maternal Grandmother   . Cancer Paternal Grandfather     Social History   Social History  . Marital status: Married    Spouse name: Vidya Bamford  . Number of children: 2  . Years of education: 15   Occupational History  . 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 Smoking status: Never Smoker  . Smokeless tobacco: Never Used  . Alcohol use No  . Drug use: No  . Sexual activity: Yes    Partners: Male    Birth control/ protection: Other-see comments     Comment: partner (Husband) s/p vasectomy   Other Topics Concern  . None   Social History Narrative  Lives with her husband.    Runs the bar her husband bought 2018 ([employer]).   Hunter graduated from college, trying to get in to [place].   Greer PickerelCoy is taking a break from school, trying to figure out what he wants to do with his life.   She left college after 3 years without a degree.   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       Review of Systems  Constitutional: Negative.   HENT: Negative.   Eyes: Negative.   Respiratory: Negative.   Cardiovascular: Negative.   Gastrointestinal: Negative.   Endocrine: Negative.   Genitourinary: Negative.   Musculoskeletal: Positive for  arthralgias (ibuprofen BIW). Negative for back pain, gait problem, joint swelling, myalgias, neck pain and neck stiffness.  Skin: Negative.   Allergic/Immunologic: Negative.   Neurological: Negative.   Hematological: Negative.   Psychiatric/Behavioral: Negative.         Objective:  Physical Exam  Constitutional: She is oriented to person, place, and time. Vital signs are normal. She appears well-developed and well-nourished. She is active and cooperative. No distress.  BP 107/72 (BP Location: Right Arm, Patient Position: Sitting, Cuff Size: Large)   Pulse 74   Temp 98.1 F (36.7 C) (Oral)   Resp 18   Ht 5' 7.5" (1.715 m)   Wt 199 lb 6.4 oz (90.4 kg)   LMP 06/14/2017   SpO2 100%   BMI 30.77 kg/m    HENT:  Head: Normocephalic and atraumatic.  Right Ear: Hearing, tympanic membrane, external ear and ear canal normal. No foreign bodies.  Left Ear: Hearing, tympanic membrane, external ear and ear canal normal. No foreign bodies.  Nose: Nose normal.  Mouth/Throat: Uvula is midline, oropharynx is clear and moist and mucous membranes are normal. No oral lesions. Normal dentition. No dental abscesses or uvula swelling. No oropharyngeal exudate.  Eyes: Conjunctivae, EOM and lids are normal. Pupils are equal, round, and reactive to light. Right eye exhibits no discharge. Left eye exhibits no discharge. No scleral icterus.  Fundoscopic exam:      The right eye shows no arteriolar narrowing, no AV nicking, no exudate, no hemorrhage and no papilledema. The right eye shows red reflex.       The left eye shows no arteriolar narrowing, no AV nicking, no exudate, no hemorrhage and no papilledema. The left eye shows red reflex.  Neck: Trachea normal, normal range of motion and full passive range of motion without pain. Neck supple. No spinous process tenderness and no muscular tenderness present. No thyroid mass and no thyromegaly present.  Cardiovascular: Normal rate, regular rhythm, normal heart  sounds, intact distal pulses and normal pulses.   Pulmonary/Chest: Effort normal and breath sounds normal. Right breast exhibits no inverted nipple, no mass, no nipple discharge, no skin change and no tenderness. Left breast exhibits no inverted nipple, no mass, no nipple discharge, no skin change and no tenderness. Breasts are symmetrical.  Musculoskeletal: She exhibits no edema or tenderness.       Cervical back: Normal.       Thoracic back: Normal.       Lumbar back: Normal.  Lymphadenopathy:       Head (right side): No tonsillar, no preauricular, no posterior auricular and no occipital adenopathy present.       Head (left side): No tonsillar, no preauricular, no posterior auricular and no occipital adenopathy present.    She has no cervical adenopathy.       Right: No supraclavicular adenopathy  present.       Left: No supraclavicular adenopathy present.  Neurological: She is alert and oriented to person, place, and time. She has normal strength and normal reflexes. No cranial nerve deficit. She exhibits normal muscle tone. Coordination and gait normal.  Skin: Skin is warm, dry and intact. No rash noted. She is not diaphoretic. No cyanosis or erythema. Nails show no clubbing.  Psychiatric: She has a normal mood and affect. Her speech is normal and behavior is normal. Judgment and thought content normal.    Wt Readings from Last 3 Encounters:  07/06/17 199 lb 6.4 oz (90.4 kg)  09/01/16 174 lb 9.6 oz (79.2 kg)  08/18/16 177 lb (80.3 kg)       Assessment & Plan:   Problem List Items Addressed This Visit    BMI 30.0-30.9,adult    Encouraged healthy eating and regular exercise.      Situational stress    Stable. Continue current treatment.      Relevant Medications   escitalopram (LEXAPRO) 10 MG tablet    Other Visit Diagnoses    Annual physical exam    -  Primary   Age appropriate health guidance provided.       Return in about 1 year (around 07/06/2018) for Annual  Exam.   Fernande Bras, PA-C Primary [facility] at [facility]

## 2017-07-06 NOTE — Assessment & Plan Note (Signed)
Encouraged healthy eating and regular exercise. 

## 2017-07-06 NOTE — Patient Instructions (Addendum)
We recommend that you schedule a mammogram for breast cancer screening. Typically, you do not need a referral to do this. Please contact a local imaging center to schedule your mammogram.  [facility] - [phone]) [phone]  *ask for the Radiology Department [facility] - [phone]) [phone] or [phone]) [phone]  [facility] - [phone] [facility] - [phone] [facility] - [phone]  *ask for the Radiology Department [facility] - [phone]) [phone]  *ask for the Radiology Department [facility] - [phone]  *ask for the Mammography Department [facility] - [phone]    IF you received an x-ray today, you will receive an invoice from [facility]. Please contact [facility] at [phone] with questions or concerns regarding your invoice.   IF you received labwork today, you will receive an invoice from [facility]. Please contact [facility] at [phone] with questions or concerns regarding your invoice.   Our billing staff will not be able to assist you with questions regarding bills from these companies.  You will be contacted with the lab results as soon as they are available. The fastest way to get your results is to activate your My Chart account. Instructions are located on the last page of this paperwork. If you have not heard from us regarding the results in 2 weeks, please contact this office.    Keeping You Healthy  Get These Tests 1. Blood Pressure- Have your blood pressure checked once a year by your health care provider.  Normal blood pressure is 120/80. 2. Weight- Have your body mass index (BMI) calculated to screen for obesity.  BMI is measure of body fat based on height and weight.  You can also calculate your own BMI at [email or web address]://www.west-esparza.com/www.nhlbisupport.com/bmi/. 3. Cholesterol- Have your cholesterol checked every 5 years starting at age 46 then yearly starting at age  145. 4. Chlamydia, HIV, and other sexually transmitted diseases- Get screened every year until age 46, then within three months of each new sexual provider. 5. Pap Test - Every 1-5 years; discuss with your health care provider. 6. Mammogram- Every 1-2 years starting at age 46--50  Take these medicines  Calcium with Vitamin D-Your body needs 1200 mg of Calcium each day and [phone] IU of Vitamin D daily.  Your body can only absorb 500 mg of Calcium at a time so Calcium must be taken in 2 or 3 divided doses throughout the day.  Multivitamin with folic acid- Once daily if it is possible for you to become pregnant.  Get these Immunizations  Gardasil-Series of three doses; prevents HPV related illness such as genital warts and cervical cancer.  Menactra-Single dose; prevents meningitis.  Tetanus shot- Every 10 years.  Flu shot-Every year.  Take these steps 1. Do not smoke-Your healthcare provider can help you quit.  For tips on how to quit go to www.smokefree.gov or call 1-800 QUITNOW. 2. Be physically active- Exercise 5 days a week for at least 30 minutes.  If you are not already physically active, start slow and gradually work up to 30 minutes of moderate physical activity.  Examples of moderate activity include walking briskly, dancing, swimming, bicycling, etc. 3. Breast Cancer- A self breast exam every month is important for early detection of breast cancer.  For more information and instruction on self breast exams, ask your healthcare provider or [place].eswww.womenshealth.gov/faq/breast-self-exam.cfm. 4. Eat a healthy diet- Eat a variety of healthy foods such as fruits, vegetables, whole grains, low  fat milk, low fat cheeses, yogurt, lean meats, poultry and fish, beans, nuts, tofu, etc.  For more information go to www. Thenutritionsource.org 5. Drink alcohol in moderation- Limit alcohol intake to one drink or less per day. Never drink and drive. 6. Depression- Your emotional health is as important  as your physical health.  If you're feeling down or losing interest in things you normally enjoy please talk to your healthcare provider about being screened for depression. 7. Dental visit- Brush and floss your teeth twice daily; visit your dentist twice a year. 8. Eye doctor- Get an eye exam at least every 2 years. 9. Helmet use- Always wear a helmet when riding a bicycle, motorcycle, rollerblading or skateboarding. 10. Safe sex- If you may be exposed to sexually transmitted infections, use a condom. 11. Seat belts- Seat belts can save your live; always wear one. 12. Smoke/Carbon Monoxide detectors- These detectors need to be installed on the appropriate level of your home. Replace batteries at least once a year. 13. Skin cancer- When out in the sun please cover up and use sunscreen 15 SPF or higher. 14. Violence- If anyone is threatening or hurting you, please tell your healthcare provider.

## 2018-03-31 ENCOUNTER — Encounter: Payer: Self-pay | Admitting: Physician Assistant

## 2020-06-09 ENCOUNTER — Other Ambulatory Visit: Payer: Self-pay

## 2020-06-09 ENCOUNTER — Emergency Department (HOSPITAL_COMMUNITY): Payer: BC Managed Care – PPO

## 2020-06-09 ENCOUNTER — Emergency Department (HOSPITAL_COMMUNITY)
Admission: EM | Admit: 2020-06-09 | Discharge: 2020-06-09 | Disposition: A | Discharge status: Home / Self Care | Payer: BC Managed Care – PPO | Attending: Emergency Medicine | Admitting: Emergency Medicine

## 2020-06-09 ENCOUNTER — Encounter (HOSPITAL_COMMUNITY): Payer: Self-pay | Admitting: Emergency Medicine

## 2020-06-09 DIAGNOSIS — Y999 Unspecified external cause status: Secondary | ICD-10-CM | POA: Insufficient documentation

## 2020-06-09 DIAGNOSIS — S6992XA Unspecified injury of left wrist, hand and finger(s), initial encounter: Secondary | ICD-10-CM | POA: Diagnosis present

## 2020-06-09 DIAGNOSIS — S060X1A Concussion with loss of consciousness of 30 minutes or less, initial encounter: Secondary | ICD-10-CM | POA: Diagnosis not present

## 2020-06-09 DIAGNOSIS — Y9389 Activity, other specified: Secondary | ICD-10-CM | POA: Insufficient documentation

## 2020-06-09 DIAGNOSIS — Y929 Unspecified place or not applicable: Secondary | ICD-10-CM | POA: Insufficient documentation

## 2020-06-09 DIAGNOSIS — S0990XA Unspecified injury of head, initial encounter: Secondary | ICD-10-CM | POA: Insufficient documentation

## 2020-06-09 DIAGNOSIS — S52592A Other fractures of lower end of left radius, initial encounter for closed fracture: Secondary | ICD-10-CM | POA: Diagnosis not present

## 2020-06-09 DIAGNOSIS — S52502A Unspecified fracture of the lower end of left radius, initial encounter for closed fracture: Secondary | ICD-10-CM

## 2020-06-09 LAB — COMPREHENSIVE METABOLIC PANEL
ALT: 15 U/L (ref 0–44)
AST: 27 U/L (ref 15–41)
Albumin: 3.2 g/dL — ABNORMAL LOW (ref 3.5–5.0)
Alkaline Phosphatase: 87 U/L (ref 38–126)
Anion gap: 9 (ref 5–15)
BUN: 11 mg/dL (ref 6–20)
CO2: 20 mmol/L — ABNORMAL LOW (ref 22–32)
Calcium: 8.5 mg/dL — ABNORMAL LOW (ref 8.9–10.3)
Chloride: 108 mmol/L (ref 98–111)
Creatinine, Ser: 0.93 mg/dL (ref 0.44–1.00)
GFR calc Af Amer: >60 mL/min (ref >60)
GFR calc non Af Amer: >60 mL/min (ref >60)
Glucose, Bld: 100 mg/dL — ABNORMAL HIGH (ref 70–99)
Potassium: 4.3 mmol/L (ref 3.5–5.1)
Sodium: 137 mmol/L (ref 135–145)
Total Bilirubin: 0.9 mg/dL (ref 0.3–1.2)
Total Protein: 6.8 g/dL (ref 6.5–8.1)

## 2020-06-09 LAB — CBC WITH DIFFERENTIAL/PLATELET
Abs Immature Granulocytes: 0.05 10*3/uL (ref 0.00–0.07)
Basophils Absolute: 0.1 10*3/uL (ref 0.0–0.1)
Basophils Relative: 1 %
Eosinophils Absolute: 0.1 10*3/uL (ref 0.0–0.5)
Eosinophils Relative: 1 %
HCT: 34.8 % — ABNORMAL LOW (ref 36.0–46.0)
Hemoglobin: 11.3 g/dL — ABNORMAL LOW (ref 12.0–15.0)
Immature Granulocytes: 0 %
Lymphocytes Relative: 10 %
Lymphs Abs: 1.2 10*3/uL (ref 0.7–4.0)
MCH: 29 pg (ref 26.0–34.0)
MCHC: 32.5 g/dL (ref 30.0–36.0)
MCV: 89.2 fL (ref 80.0–100.0)
Monocytes Absolute: 0.7 10*3/uL (ref 0.1–1.0)
Monocytes Relative: 6 %
Neutro Abs: 10 10*3/uL — ABNORMAL HIGH (ref 1.7–7.7)
Neutrophils Relative %: 82 %
Platelets: 362 10*3/uL (ref 150–400)
RBC: 3.9 MIL/uL (ref 3.87–5.11)
RDW: 14.3 % (ref 11.5–15.5)
WBC: 12.1 10*3/uL — ABNORMAL HIGH (ref 4.0–10.5)
nRBC: 0 % (ref 0.0–0.2)

## 2020-06-09 MED ORDER — OXYCODONE-ACETAMINOPHEN 5-325 MG PO TABS: 2.0000 | ORAL_TABLET | Freq: Three times a day (TID) | ORAL | 0 refills | Status: DC | PRN

## 2020-06-09 MED ORDER — ONDANSETRON 4 MG PO TBDP: 4.0000 mg | ORAL_TABLET | Freq: Three times a day (TID) | ORAL | 0 refills | Status: AC | PRN

## 2020-06-09 MED ORDER — OXYCODONE-ACETAMINOPHEN 5-325 MG PO TABS: 2.0000 | ORAL_TABLET | Freq: Three times a day (TID) | ORAL | 0 refills | Status: AC | PRN

## 2020-06-09 MED ORDER — ONDANSETRON 4 MG PO TBDP
4.0000 mg | ORAL_TABLET | Freq: Three times a day (TID) | ORAL | 0 refills | Status: DC | PRN
Start: 2020-06-09 — End: 2020-06-09

## 2020-06-09 MED ORDER — ONDANSETRON HCL 4 MG/2ML IJ SOLN
4.0000 mg | Freq: Once | INTRAMUSCULAR | Status: AC
  Administered 2020-06-09: 4 mg via INTRAVENOUS
  Filled 2020-06-09: qty 2

## 2020-06-09 MED ORDER — FENTANYL CITRATE (PF) 100 MCG/2ML IJ SOLN
50.0000 ug | Freq: Once | INTRAMUSCULAR | Status: AC
  Administered 2020-06-09: 50 ug via INTRAVENOUS
  Filled 2020-06-09: qty 2

## 2020-06-09 NOTE — ED Triage Notes (Signed)
Pt fell off 15.2 hand horse just PTA.  Reports + LOC.  C/o L wrist pain and deformity. Denies neck and back pain.

## 2020-06-09 NOTE — ED Provider Notes (Signed)
[facility] Provider Note   CSN: [id number] Arrival date & time: 06/09/20  1353     History Chief Complaint  Patient presents with  . Wrist Pain  . fell off horse    Kelli Potter is a 49 y.o. female with no pertinent past medical history that presents the emergency department today for fall off a horse that occurred this afternoon.  Patient does not remember what happened, has memory loss about 15 to 20 minutes before the event and does not remember anything after the event until coming into the emergency department.  Does have loss of consciousness with head injury.  Does not remember hitting her head.  Is primarily complaining of wrist pain.  Does not state that her head hurts.  Is not on blood thinners.  Denies alcohol use.  Has never had head injury or wrist injury before.  States that someone was with her while riding the horse, states that they immediately took her home, and her husband brought her into the emergency room.  Does not remember if the horse kicked her out for she fell off.  Denies any vomiting.  Does state she feels slightly nauseous.  Denies any photophobia, phonophobia.  Denies any blurry vision or vision changes.  Denies any chest pain, shortness of breath.  Was in normal health before this.  Denies any dizziness or lightheadedness.  States that she did not eat breakfast, does not remember if she ate anything after that.  HPI     Past Medical History:  Diagnosis Date  . Allergy   . Asthma   . History of kidney infection "as a child"    Patient Active Problem List   Diagnosis Date Noted  . History of multiple miscarriages 07/06/2017  . Situational stress 07/06/2017  . S/P laparoscopic appendectomy 05/14/2015  . BMI 30.0-30.9,adult 03/26/2015  . History of abnormal cervical Pap smear 03/26/2015    Past Surgical History:  Procedure Laterality Date  . APPENDECTOMY  05/14/2015  . kidney evaluation under anesthesia   "as a child"  . LAPAROSCOPIC APPENDECTOMY N/A 05/14/2015   Procedure: APPENDECTOMY LAPAROSCOPIC;  Surgeon: Rolm Bookbinder, MD;  Location: [facility];  Service: General;  Laterality: N/A;     OB History   No obstetric history on file.     Family History  Problem Relation Age of Onset  . Pulmonary fibrosis Mother   . Hypertension Father   . Eating disorder Sister   . Thyroid disease Brother        Graves Disease  . Hyperlipidemia Brother   . Stroke Maternal Grandmother   . Cancer Paternal Grandfather     Social History   Tobacco Use  . Smoking status: Never Smoker  . Smokeless tobacco: Never Used  Vaping Use  . Vaping Use: Never used  Substance Use Topics  . Alcohol use: No    Alcohol/week: 0.0 standard drinks  . Drug use: No    Home Medications Prior to Admission medications   Medication Sig Start Date End Date Taking? Authorizing Provider  escitalopram (LEXAPRO) 10 MG tablet Take 1 tablet (10 mg total) by mouth daily. 07/06/17   Harrison Mons, PA  ibuprofen (ADVIL,MOTRIN) 100 MG chewable tablet Chew by mouth every 8 (eight) hours as needed.    [provider]  Multiple Vitamins-Minerals (MULTIVITAMIN WITH MINERALS) tablet Take 1 tablet by mouth daily.    [provider]  naproxen sodium (ANAPROX) 220 MG tablet Take 220 mg by  mouth 2 (two) times daily with a meal.    [provider]  ondansetron (ZOFRAN ODT) 4 MG disintegrating tablet Take 1 tablet (4 mg total) by mouth every 8 (eight) hours as needed for nausea or vomiting. 06/09/20   Farrel Gordon, PA-C  oxyCODONE-acetaminophen (PERCOCET/ROXICET) 5-325 MG tablet Take 2 tablets by mouth every 8 (eight) hours as needed for up to 3 days for severe pain. 06/09/20 06/12/20  Farrel Gordon, PA-C    Allergies    Latex  Review of Systems   Review of Systems  Constitutional: Negative for chills, diaphoresis, fatigue and fever.  HENT: Negative for congestion, sore throat and trouble swallowing.   Eyes:  Negative for pain and visual disturbance.  Respiratory: Negative for cough, shortness of breath and wheezing.   Cardiovascular: Negative for chest pain, palpitations and leg swelling.  Gastrointestinal: Negative for abdominal distention, abdominal pain, diarrhea, nausea and vomiting.  Genitourinary: Negative for difficulty urinating.  Musculoskeletal: Positive for arthralgias (Wrist pain, left) and joint swelling. Negative for back pain, neck pain and neck stiffness.  Skin: Negative for pallor.  Neurological: Negative for dizziness, speech difficulty, weakness and headaches.  Psychiatric/Behavioral: Negative for confusion.    Physical Exam Updated Vital Signs BP 115/73   Pulse (!) 59   Temp 98.3 F (36.8 C) (Oral)   Resp 10   SpO2 97%   Physical Exam Constitutional:      General: She is not in acute distress.    Appearance: Normal appearance. She is not ill-appearing, toxic-appearing or diaphoretic.  HENT:     Head: Normocephalic and atraumatic. No raccoon eyes, Battle's sign, contusion or laceration.     Nose: Nose normal. No rhinorrhea.     Mouth/Throat:     Mouth: Mucous membranes are moist.     Pharynx: Oropharynx is clear.  Eyes:     General: No visual field deficit or scleral icterus.    Extraocular Movements: Extraocular movements intact.     Right eye: Normal extraocular motion.     Left eye: Normal extraocular motion.     Conjunctiva/sclera: Conjunctivae normal.     Right eye: Right conjunctiva is not injected. No hemorrhage.    Left eye: Left conjunctiva is not injected. No hemorrhage.    Pupils: Pupils are equal, round, and reactive to light.  Cardiovascular:     Rate and Rhythm: Normal rate and regular rhythm.     Pulses: Normal pulses.     Heart sounds: Normal heart sounds.  Pulmonary:     Effort: Pulmonary effort is normal. No respiratory distress.     Breath sounds: Normal breath sounds. No stridor. No wheezing, rhonchi or rales.     Comments: No  tenderness over anterior or posterior chest.  No flail segments noted.  Patient with equal expansion and rise of chest. Chest:     Chest wall: No tenderness.  Abdominal:     General: Abdomen is flat. There is no distension.     Palpations: Abdomen is soft.     Tenderness: There is no abdominal tenderness. There is no guarding or rebound.  Musculoskeletal:        General: No swelling or tenderness. Normal range of motion.     Cervical back: Normal range of motion and neck supple. No rigidity.     Right lower leg: No edema.     Left lower leg: No edema.     Comments: No cervical, thoracic, lumbar midline spine tenderness.  No tenderness to rectum.  No  tenderness or ecchymosis noted on any extremities besides the left wrist.  No sternal tenderness.  Left wrist with clear deformity, no open fracture.  Is able to move wrist in all directions, however is restricted due to pain.  Normal cap refill.  Radial pulse 2+.  Normal sensation throughout.  No overlying ecchymosis or skin changes.  Left elbow with normal strength. right upper extremity without any abnormalities.  Skin:    General: Skin is warm and dry.     Capillary Refill: Capillary refill takes less than 2 seconds.     Coloration: Skin is not pale.  Neurological:     General: No focal deficit present.     Mental Status: She is alert and oriented to person, place, and time.     Comments: Alert. Clear speech. No facial droop. CNIII-XII grossly intact. Bilateral upper and lower extremities' sensation grossly intact. 5/5 symmetric strength with grip strength and with plantar and dorsi flexion bilaterally. Normal finger to nose bilaterally. Negative pronator drift. Negative Romberg sign. Gait is steady and intact    Psychiatric:        Mood and Affect: Mood normal.        Behavior: Behavior normal.     ED Results / Procedures / Treatments   Labs (all labs ordered are listed, but only abnormal results are displayed) Labs Reviewed    COMPREHENSIVE METABOLIC PANEL - Abnormal; Notable for the following components:      Result Value   CO2 20 (*)    Glucose, Bld 100 (*)    Calcium 8.5 (*)    Albumin 3.2 (*)    All other components within normal limits  CBC WITH DIFFERENTIAL/PLATELET - Abnormal; Notable for the following components:   WBC 12.1 (*)    Hemoglobin 11.3 (*)    HCT 34.8 (*)    Neutro Abs 10.0 (*)    All other components within normal limits  CBG MONITORING, ED    EKG None  Radiology DG Wrist Complete Left  Result Date: 06/09/2020 CLINICAL DATA:  Pain following fall from horse EXAM: LEFT WRIST - COMPLETE 3+ VIEW COMPARISON:  None. FINDINGS: Frontal, oblique, and lateral views were obtained. There is a comminuted fracture of the distal radial metaphysis with dorsal angulation distally and impaction at the fracture site. There is avulsion of the ulnar styloid. No other fractures are evident. No dislocation. Joint spaces appear normal. IMPRESSION: Comminuted fracture distal radial metaphysis with dorsal angulation distally and impaction of the major fracture fragments. Avulsion ulnar styloid. No dislocation. No evident arthropathy. Electronically Signed   By: Bretta BangWilliam  Woodruff III M.D.   On: 06/09/2020 15:06   CT Head Wo Contrast  Result Date: 06/09/2020 CLINICAL DATA:  Head trauma, headache, loss of consciousness EXAM: CT HEAD WITHOUT CONTRAST CT CERVICAL SPINE WITHOUT CONTRAST TECHNIQUE: Multidetector CT imaging of the head and cervical spine was performed following the standard protocol without intravenous contrast. Multiplanar CT image reconstructions of the cervical spine were also generated. COMPARISON:  CT cervical spine, 05/10/2004 FINDINGS: CT HEAD FINDINGS Brain: No evidence of acute infarction, hemorrhage, hydrocephalus, extra-axial collection or mass lesion/mass effect. Vascular: No hyperdense vessel or unexpected calcification. Skull: Normal. Negative for fracture or focal lesion. Sinuses/Orbits:  Frothy air-fluid levels in the partially imaged bilateral maxillary sinuses. 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 Heterogeneous appearance of the thyroid, unchanged compared to remote prior examination dated 2005. Stability for greater than 5 years implies benignity; no biopsy or followup  indicated (ref: J Am Coll Radiol. 2015 Feb;12(2): 143-50). CT CERVICAL SPINE FINDINGS Alignment: Normal. Skull base and vertebrae: No acute fracture. No primary bone lesion or focal pathologic process. Soft tissues and spinal canal: No prevertebral fluid or swelling. No visible canal hematoma. Disc levels:  Intact. Upper chest: Negative. Other: None. IMPRESSION: 1.  No acute intracranial pathology. 2. Frothy air-fluid level in the partially imaged bilateral maxillary sinuses. Correlate for sinusitis. 3. 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 Correlate for referable symptoms. 4.  No fracture or static subluxation of the cervical spine. Electronically Signed   By: Lauralyn Primes M.D.   On: 06/09/2020 16:18   CT Cervical Spine Wo Contrast  Result Date: 06/09/2020 CLINICAL DATA:  Head trauma, headache, loss of consciousness EXAM: CT HEAD WITHOUT CONTRAST CT CERVICAL SPINE WITHOUT CONTRAST TECHNIQUE: Multidetector CT imaging of the head and cervical spine was performed following the standard protocol without intravenous contrast. Multiplanar CT image reconstructions of the cervical spine were also generated. COMPARISON:  CT cervical spine, 05/10/2004 FINDINGS: CT HEAD FINDINGS Brain: No evidence of acute infarction, hemorrhage, hydrocephalus, extra-axial collection or mass lesion/mass effect. Vascular: No hyperdense vessel or unexpected calcification. Skull: Normal. Negative for fracture or focal lesion. Sinuses/Orbits: Frothy air-fluid levels in the partially imaged bilateral maxillary sinuses. 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 Heterogeneous  appearance of the thyroid, unchanged compared to remote prior examination dated 2005. Stability for greater than 5 years implies benignity; no biopsy or followup indicated (ref: J Am Coll Radiol. 2015 Feb;12(2): 143-50). CT CERVICAL SPINE FINDINGS Alignment: Normal. Skull base and vertebrae: No acute fracture. No primary bone lesion or focal pathologic process. Soft tissues and spinal canal: No prevertebral fluid or swelling. No visible canal hematoma. Disc levels:  Intact. Upper chest: Negative. Other: None. IMPRESSION: 1.  No acute intracranial pathology. 2. Frothy air-fluid level in the partially imaged bilateral maxillary sinuses. Correlate for sinusitis. 3. 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 Correlate for referable symptoms. 4.  No fracture or static subluxation of the cervical spine. Electronically Signed   By: Lauralyn Primes M.D.   On: 06/09/2020 16:18   DG Chest Port 1 View  Result Date: 06/09/2020 CLINICAL DATA:  Trauma, fall from horse today EXAM: PORTABLE CHEST 1 VIEW COMPARISON:  05/10/2004 chest radiograph. FINDINGS: Stable cardiomediastinal silhouette with normal heart size. No definite pneumothorax. No pleural effusion. Lungs appear clear, with no acute consolidative airspace disease and no pulmonary edema. Slightly displaced posterolateral left eighth and ninth rib fractures. IMPRESSION: Slightly displaced posterolateral left eighth and ninth rib fractures. No pneumothorax. No active cardiopulmonary disease. Electronically Signed   By: Delbert Phenix M.D.   On: 06/09/2020 16:25    Procedures Procedures (including critical care time)  Medications Ordered in ED Medications  fentaNYL (SUBLIMAZE) injection 50 mcg (50 mcg Intravenous Given 06/09/20 1543)  ondansetron (ZOFRAN) injection 4 mg (4 mg Intravenous Given 06/09/20 1543)    ED Course  I have reviewed the triage vital signs and the nursing notes.  Pertinent labs & imaging results that were available during my care of the  patient were reviewed by me and considered in my medical decision making (see chart for details).    MDM Rules/Calculators/A&P                         Kelli Potter is a 50 y.o. female with no pertinent past medical history that presents the emergency department  today for fall off a horse that occurred this afternoon.  Wrist with obvious fracture on physical exam.  Normal neuro exam.  Gait steady.    Wrist x-ray shows comminuted fracture of the distal radial metaphyseal physis with dorsal angulation, no dislocation.  Will consult hand surgery at this time. 3:40 spoke to Dr. Eulah Pont, hand surgeon who states that patient needs sugar tong splint and outpatient follow-up in the morning with him.  CT head with no acute intracranial abnormalities, did discuss incidental findings with the patient and put in dispo. Chest x-ray with eighth and ninth rib fractures, patient without any pain in this area.  Patient states that 10 years ago she had the same fractures, seems as if this is an old injury.  Did see x-rays from 10 years ago with similar findings.  Stable CBC and CMP.  Patient has received sugar tong splint and pain is under control.  Patient has vomited once while in the emergency department.  Steady gait.  Patient is ready to leave.  Patient is with husband, seems reliable for follow-up.  Will discharge home on some breakthrough pain medications along with Zofran.  Doubt need for further emergent work up at this time. I explained the diagnosis and have given explicit precautions to return to the ER including for any other new or worsening symptoms. The patient understands and accepts the medical plan as it's been dictated and I have answered their questions. Discharge instructions concerning home care and prescriptions have been given. The patient is STABLE and is discharged to home in good condition.  I discussed this case with my attending physician who cosigned this note including patient's  presenting symptoms, physical exam, and planned diagnostics and interventions. Attending physician stated agreement with plan or made changes to plan which were implemented.   Attending physician assessed patient at bedside.    Final Clinical Impression(s) / ED Diagnoses Final diagnoses:  Fall from horse, initial encounter  Closed fracture of distal end of left radius, unspecified fracture morphology, initial encounter  Concussion with loss of consciousness of 30 minutes or less, initial encounter    Rx / DC Orders ED Discharge Orders         Ordered    oxyCODONE-acetaminophen (PERCOCET/ROXICET) 5-325 MG tablet  Every 8 hours PRN,   Status:  Discontinued     Reprint     06/09/20 1731    ondansetron (ZOFRAN ODT) 4 MG disintegrating tablet  Every 8 hours PRN,   Status:  Discontinued     Reprint     06/09/20 1731    oxyCODONE-acetaminophen (PERCOCET/ROXICET) 5-325 MG tablet  Every 8 hours PRN,   Status:  Discontinued     Reprint     06/09/20 1802    ondansetron (ZOFRAN ODT) 4 MG disintegrating tablet  Every 8 hours PRN     Discontinue  Reprint     06/09/20 1802    oxyCODONE-acetaminophen (PERCOCET/ROXICET) 5-325 MG tablet  Every 8 hours PRN     Discontinue  Reprint     06/09/20 1804           Farrel Gordon, PA-C 06/09/20 1941    Sabas Sous, MD 06/09/20 2332

## 2020-06-09 NOTE — Discharge Instructions (Signed)
You are seen today for falling off a horse.  You most likely have a concussion with your symptoms, I want you to use the attached guidelines for this.  Want you to follow-up with your primary care in the next couple of days to monitor your symptoms.  I want you to come back to the emergency department if you have any worsening or new concerning symptoms, vision changes, weakness, instability, falls, worsening headache.  I also attached a guide on radial fractures.  I want you to follow-up with Dr. Eulah Pont tomorrow morning at 830 as we discussed.  I want you to go ahead and take ibuprofen for the pain, if the pain becomes severe you can use the Percocet for breakthrough pain.  Do not take more than 1 every 8 hours.  I also prescribed you Zofran you can take if you feel nauseous.  Do not take these more than 1 every 8 hours as well.  As we discussed the incidental findings of your head CT showed sinusitis as well as arthrosis of your right mandibular head, you can findings on my chart.  I hope you feel better!

## 2020-06-09 NOTE — ED Notes (Signed)
Ortho paged and responded. 

## 2020-06-09 NOTE — Progress Notes (Signed)
Orthopedic Tech Progress Note Patient Details:  Kelli Potter 15-Oct-1971 [id number]  Ortho Devices Type of Ortho Device: Sugartong splint, Arm sling Ortho Device/Splint Location: ULE Ortho Device/Splint Interventions: Application, Ordered   Post Interventions Patient Tolerated: Well Instructions Provided: Care of device   [name] [name] 06/09/2020, 5:12 PM
